# Patient Record
Sex: Male | Born: 1970 | Race: White | Hispanic: No | Marital: Married | Smoking: Former smoker
Health system: Southern US, Community
[De-identification: ages and names within clinical notes are randomized; demographics above are authoritative.]

## PROBLEM LIST (undated history)

## (undated) DIAGNOSIS — G4700 Insomnia, unspecified: Secondary | ICD-10-CM

## (undated) DIAGNOSIS — G241 Genetic torsion dystonia: Secondary | ICD-10-CM

## (undated) DIAGNOSIS — W19XXXA Unspecified fall, initial encounter: Secondary | ICD-10-CM

## (undated) DIAGNOSIS — G4730 Sleep apnea, unspecified: Secondary | ICD-10-CM

## (undated) DIAGNOSIS — R296 Repeated falls: Secondary | ICD-10-CM

## (undated) DIAGNOSIS — IMO0002 Reserved for concepts with insufficient information to code with codable children: Secondary | ICD-10-CM

## (undated) DIAGNOSIS — G249 Dystonia, unspecified: Secondary | ICD-10-CM

## (undated) HISTORY — PX: SHOULDER SURGERY: SHX246

## (undated) HISTORY — DX: Genetic torsion dystonia: G24.1

## (undated) HISTORY — DX: Dystonia, unspecified: G24.9

## (undated) HISTORY — DX: Reserved for concepts with insufficient information to code with codable children: IMO0002

## (undated) HISTORY — PX: KNEE SURGERY: SHX244

## (undated) HISTORY — DX: Repeated falls: R29.6

## (undated) HISTORY — DX: Insomnia, unspecified: G47.30

## (undated) HISTORY — DX: Insomnia, unspecified: G47.00

## (undated) HISTORY — DX: Unspecified fall, initial encounter: W19.XXXA

---

## 2001-10-01 ENCOUNTER — Emergency Department (HOSPITAL_COMMUNITY): Admission: EM | Admit: 2001-10-01 | Discharge: 2001-10-01 | Payer: Self-pay | Admitting: Emergency Medicine

## 2003-07-21 ENCOUNTER — Emergency Department (HOSPITAL_COMMUNITY): Admission: EM | Admit: 2003-07-21 | Discharge: 2003-07-21 | Payer: Self-pay | Admitting: Podiatry

## 2003-11-11 ENCOUNTER — Emergency Department (HOSPITAL_COMMUNITY): Admission: EM | Admit: 2003-11-11 | Discharge: 2003-11-12 | Payer: Self-pay

## 2003-11-16 ENCOUNTER — Ambulatory Visit: Payer: Self-pay | Admitting: Pain Medicine

## 2003-11-23 ENCOUNTER — Ambulatory Visit: Payer: Self-pay | Admitting: Pain Medicine

## 2003-12-02 ENCOUNTER — Ambulatory Visit: Payer: Self-pay | Admitting: Pain Medicine

## 2003-12-10 ENCOUNTER — Ambulatory Visit: Payer: Self-pay | Admitting: Pain Medicine

## 2003-12-28 ENCOUNTER — Ambulatory Visit: Payer: Self-pay | Admitting: Physician Assistant

## 2004-01-06 ENCOUNTER — Ambulatory Visit: Payer: Self-pay | Admitting: Physician Assistant

## 2004-01-19 ENCOUNTER — Ambulatory Visit: Payer: Self-pay | Admitting: Physician Assistant

## 2004-02-18 ENCOUNTER — Ambulatory Visit: Payer: Self-pay | Admitting: Physician Assistant

## 2004-02-23 ENCOUNTER — Ambulatory Visit: Payer: Self-pay | Admitting: General Practice

## 2004-03-14 ENCOUNTER — Ambulatory Visit: Payer: Self-pay | Admitting: Pain Medicine

## 2004-03-23 ENCOUNTER — Ambulatory Visit: Payer: Self-pay | Admitting: Pain Medicine

## 2004-03-24 ENCOUNTER — Ambulatory Visit: Payer: Self-pay | Admitting: Pain Medicine

## 2004-04-07 ENCOUNTER — Ambulatory Visit: Payer: Self-pay | Admitting: Physician Assistant

## 2004-05-10 ENCOUNTER — Ambulatory Visit: Payer: Self-pay | Admitting: Physician Assistant

## 2004-06-29 ENCOUNTER — Ambulatory Visit: Payer: Self-pay | Admitting: Pain Medicine

## 2004-07-20 ENCOUNTER — Ambulatory Visit: Payer: Self-pay | Admitting: Pain Medicine

## 2004-07-27 ENCOUNTER — Ambulatory Visit: Payer: Self-pay | Admitting: Pain Medicine

## 2004-10-14 ENCOUNTER — Encounter: Admission: RE | Admit: 2004-10-14 | Discharge: 2004-10-14 | Payer: Self-pay | Admitting: Neurology

## 2004-11-14 ENCOUNTER — Ambulatory Visit: Payer: Self-pay | Admitting: Pain Medicine

## 2004-11-17 ENCOUNTER — Ambulatory Visit: Payer: Self-pay | Admitting: Pain Medicine

## 2004-12-06 ENCOUNTER — Ambulatory Visit: Payer: Self-pay | Admitting: Physician Assistant

## 2004-12-20 ENCOUNTER — Ambulatory Visit: Payer: Self-pay | Admitting: Physician Assistant

## 2004-12-23 ENCOUNTER — Encounter: Payer: Self-pay | Admitting: Pain Medicine

## 2005-11-23 ENCOUNTER — Ambulatory Visit (HOSPITAL_COMMUNITY): Admission: RE | Admit: 2005-11-23 | Discharge: 2005-11-23 | Payer: Self-pay | Admitting: Neurology

## 2005-12-25 ENCOUNTER — Encounter: Payer: Self-pay | Admitting: Neurology

## 2006-01-19 ENCOUNTER — Ambulatory Visit (HOSPITAL_COMMUNITY): Admission: RE | Admit: 2006-01-19 | Discharge: 2006-01-19 | Payer: Self-pay | Admitting: Internal Medicine

## 2006-04-04 ENCOUNTER — Encounter: Payer: Self-pay | Admitting: Neurology

## 2006-05-01 ENCOUNTER — Encounter: Admission: RE | Admit: 2006-05-01 | Discharge: 2006-05-01 | Payer: Self-pay | Admitting: Neurology

## 2006-06-13 ENCOUNTER — Emergency Department (HOSPITAL_COMMUNITY): Admission: EM | Admit: 2006-06-13 | Discharge: 2006-06-13 | Payer: Self-pay | Admitting: Emergency Medicine

## 2006-10-11 ENCOUNTER — Emergency Department (HOSPITAL_COMMUNITY): Admission: EM | Admit: 2006-10-11 | Discharge: 2006-10-11 | Payer: Self-pay | Admitting: Emergency Medicine

## 2006-11-05 ENCOUNTER — Encounter: Payer: Self-pay | Admitting: Internal Medicine

## 2006-12-01 ENCOUNTER — Encounter: Payer: Self-pay | Admitting: Internal Medicine

## 2007-04-26 ENCOUNTER — Ambulatory Visit: Payer: Self-pay | Admitting: Vascular Surgery

## 2007-04-26 ENCOUNTER — Ambulatory Visit (HOSPITAL_COMMUNITY): Admission: RE | Admit: 2007-04-26 | Discharge: 2007-04-26 | Payer: Self-pay | Admitting: Orthopedic Surgery

## 2007-04-26 ENCOUNTER — Encounter (INDEPENDENT_AMBULATORY_CARE_PROVIDER_SITE_OTHER): Payer: Self-pay | Admitting: Orthopedic Surgery

## 2008-05-27 ENCOUNTER — Emergency Department: Payer: Self-pay | Admitting: Emergency Medicine

## 2008-07-26 ENCOUNTER — Emergency Department: Payer: Self-pay | Admitting: Emergency Medicine

## 2010-02-20 ENCOUNTER — Encounter: Payer: Self-pay | Admitting: Neurology

## 2010-02-20 ENCOUNTER — Encounter: Payer: Self-pay | Admitting: Orthopedic Surgery

## 2010-02-28 ENCOUNTER — Encounter: Admission: RE | Admit: 2010-02-28 | Payer: Self-pay | Source: Home / Self Care | Admitting: Neurology

## 2010-03-07 ENCOUNTER — Ambulatory Visit: Payer: PRIVATE HEALTH INSURANCE | Admitting: Occupational Therapy

## 2010-03-10 ENCOUNTER — Encounter: Payer: Self-pay | Admitting: Occupational Therapy

## 2010-06-17 NOTE — Op Note (Signed)
NAME:  Brandon Lin, Brandon Lin             ACCOUNT NO.:  [id number]   MEDICAL RECORD NO.:  [id number]          PATIENT TYPE:  AMB   LOCATION:  SDS                          FACILITY:  [facility]   PHYSICIAN:  Burnard Bunting, M.D.    DATE OF BIRTH:  [birth year]   DATE OF PROCEDURE:  DATE OF DISCHARGE:  01/19/2006                               OPERATIVE REPORT   PREOPERATIVE DIAGNOSES:  Right knee chondromalacia, meniscal tears,  lateral meniscal tear, and anterior cruciate ligament cyst.   POSTOPERATIVE DIAGNOSES:  Right knee chondromalacia, meniscal tears,  lateral meniscal tear, and anterior cruciate ligament cyst.   PROCEDURE:  Right knee diagnostic arthroscopy with debridement of  chondromalacia on the undersurface of the trochlea and decompression  anterior cruciate ligament cyst and partial lateral meniscectomy.   SURGEON:  Dorene Grebe, MD   ASSISTANT:  None.   ANESTHESIA:  Monitored anesthesia care plus local.   ESTIMATED BLOOD LOSS:  Minimal.   INDICATIONS:  Brandon Lin is a 40 year old patient, who is status  post ACL reconstruction, who presents with locking and inability to  fully extend his left knee.  He presents now for operative management  after failure of conservative management.   PROCEDURE IN DETAIL:  The patient brought to the operating room where  MAC with local anesthetic was induced.  Preoperative Vibramycin  administered.  Right leg, including the foot, was prepped and draped in  a sterile manner. Anatomy was defined with medial and lateral joint line  marked with the patella border  Anterior inferolateral portal was first  established under direct visualization.  Diagnostic arthroscopy was  performed.  Patellofemoral compartment was intact on the under surface  of the patella but there was grade 2-3 chondromalacia in the lining of  the trochlea with unstable chondral flaps which were debrided back to a  stable rim using a combination of basket punch and shaver.   The patient  had significant degenerative changes in the medial compartment.  The  patient had prior partial medial meniscectomy.  The patient also had a  cyst at the stump of his ACL reconstruction.  The ACL reconstruction  itself appeared to be generally intact; although, the ligament was  frayed and somewhat disbanded.  The cyst in the ACL notch was  decompressed.  The lateral compartment showed intact articular cartilage  on the femoral and tibial side with a lateral meniscal tear which was  debrided back to a  stable rim using a combination of basket punch and shavers.  At this  time, the knee joint was thoroughly irrigated.  Instruments were removed  and the portals sites were closed using 3-0 nylon suture.  Morphine,Marcaine and clonidine was injected into the knee.  The patient  tolerated the procedure well without any immediate complications.      Burnard Bunting, M.D.  Electronically Signed     GSD/MEDQ  D:  03/01/2006  T:  03/01/2006  Job:  [id number]

## 2010-07-08 ENCOUNTER — Encounter: Payer: Self-pay | Admitting: Family Medicine

## 2010-07-08 ENCOUNTER — Inpatient Hospital Stay (INDEPENDENT_AMBULATORY_CARE_PROVIDER_SITE_OTHER)
Admission: RE | Admit: 2010-07-08 | Discharge: 2010-07-08 | Disposition: A | Discharge status: Home / Self Care | Payer: PRIVATE HEALTH INSURANCE | Source: Ambulatory Visit | Attending: Family Medicine | Admitting: Family Medicine

## 2010-07-08 DIAGNOSIS — T148XXA Other injury of unspecified body region, initial encounter: Secondary | ICD-10-CM

## 2010-07-12 ENCOUNTER — Telehealth (INDEPENDENT_AMBULATORY_CARE_PROVIDER_SITE_OTHER): Payer: Self-pay | Admitting: *Deleted

## 2010-08-01 ENCOUNTER — Other Ambulatory Visit: Payer: Self-pay | Admitting: Internal Medicine

## 2010-10-31 ENCOUNTER — Ambulatory Visit: Payer: Self-pay | Admitting: Internal Medicine

## 2010-11-01 ENCOUNTER — Ambulatory Visit: Payer: Self-pay | Admitting: Internal Medicine

## 2010-11-08 ENCOUNTER — Other Ambulatory Visit: Payer: Self-pay | Admitting: Family Medicine

## 2010-11-08 ENCOUNTER — Inpatient Hospital Stay: Payer: Self-pay | Admitting: Internal Medicine

## 2010-11-09 DIAGNOSIS — R0602 Shortness of breath: Secondary | ICD-10-CM

## 2011-01-02 NOTE — Progress Notes (Signed)
Summary: RIGHT ARM LACERATION ABOVE ELBOW   Vital Signs:  Patient Profile:   40 Years Old Male CC:      Laceration to rt forearm today Tetanus is up-to-date Height:     68.5 inches Weight:      250 pounds O2 Sat:      99 % O2 treatment:    Room Air Temp:     98.7 degrees F oral Pulse rate:   91 / minute Pulse rhythm:   regular Resp:     16 per minute BP sitting:   110 / 77  (left arm) Cuff size:   large  Vitals Entered By: Emilio Math (July 08, 2010 4:54 PM)                  Current Allergies: No known allergies History of Present Illness Chief Complaint: Laceration to rt forearm today Tetanus is up-to-date History of Present Illness:  Subjective:  Patient complains of laceration to right forearm by sheet metal today.  Current Meds BACLOFEN 20 MG TABS (BACLOFEN)  ZANAFLEX 6 MG CAPS (TIZANIDINE HCL)  LYRICA 75 MG CAPS (PREGABALIN)  LORTAB 5 5-500 MG TABS (HYDROCODONE-ACETAMINOPHEN) One or two tabs by mouth q4 to 6hr as needed pain  REVIEW OF SYSTEMS Constitutional Symptoms      Denies fever, chills, night sweats, weight loss, weight gain, and fatigue.  Eyes       Denies change in vision, eye pain, eye discharge, glasses, contact lenses, and eye surgery. Ear/Nose/Throat/Mouth       Denies hearing loss/aids, change in hearing, ear pain, ear discharge, dizziness, frequent runny nose, frequent nose bleeds, sinus problems, sore throat, hoarseness, and tooth pain or bleeding.  Respiratory       Denies dry cough, productive cough, wheezing, shortness of breath, asthma, bronchitis, and emphysema/COPD.  Cardiovascular       Denies murmurs, chest pain, and tires easily with exhertion.    Gastrointestinal       Denies stomach pain, nausea/vomiting, diarrhea, constipation, blood in bowel movements, and indigestion. Genitourniary       Denies painful urination, kidney stones, and loss of urinary control. Neurological       Denies paralysis, seizures, and  fainting/blackouts. Musculoskeletal       Denies muscle pain, joint pain, joint stiffness, decreased range of motion, redness, swelling, muscle weakness, and gout.  Skin       Denies bruising, unusual mles/lumps or sores, and hair/skin or nail changes.      Comments: Laceration Psych       Denies mood changes, temper/anger issues, anxiety/stress, speech problems, depression, and sleep problems.  Past History:  Past Medical History: RSD Dystonia  Past Surgical History: Shoulder nerve decompressions ACL  Rt Knee  Social History: Non smoker No ETOH No DRugs   Objective:  No acute distress  Right forearm:  4cm long superficial laceration proximally.  Distal neurovascular intact  Assessment New Problems: LACERATION (ICD-879.8)   Plan New Medications/Changes: LORTAB 5 5-500 MG TABS (HYDROCODONE-ACETAMINOPHEN) One or two tabs by mouth q4 to 6hr as needed pain  #12 (twelve) x 0, 07/08/2010, Donna Christen MD  New Orders: Repair Superficial Wound(s) 2.6cm to 7.5cm [12002] New Patient Level III [99203] Planning Comments:   Advised to change bandage daily and apply Bacitracin.  Return in 10 days for suture removal. Return for any signs of infection. Rx for Lortab for pain.   The patient and/or caregiver has been counseled thoroughly with regard to medications prescribed including  dosage, schedule, interactions, rationale for use, and possible side effects and they verbalize understanding.  Diagnoses and expected course of recovery discussed and will return if not improved as expected or if the condition worsens. Patient and/or caregiver verbalized understanding.   PROCEDURE:  Suture Site: Right forearm Size: 4cm Number of Lacerations: One Anesthesia: 1% Lidocaine w/epinephrine Procedure: Procedure:  Laceration Repair Discussed benefits and risks of procedure and verbal consent obtained. Using sterile technique and local 1% lidocaine with epinephrine, cleansed wound with  Betadine followed by copious lavage with normal saline.  Wound carefully inspected for debris and foreign bodies; none found.  Wound closed with #7 , 4-0 interrupted nylon sutures.  Bacitracin and non-stick sterile dressing applied.  Wound precautions explained to patient.    Disposition: Home Prescriptions: LORTAB 5 5-500 MG TABS (HYDROCODONE-ACETAMINOPHEN) One or two tabs by mouth q4 to 6hr as needed pain  #12 (twelve) x 0   Entered and Authorized by:   Donna Christen MD   Signed by:   Donna Christen MD on 07/08/2010   Method used:   Print then Give to Patient   RxID:   [id number]   Orders Added: 1)  Repair Superficial Wound(s) 2.6cm to 7.5cm [12002] 2)  New Patient Level III [28413]

## 2011-01-02 NOTE — Telephone Encounter (Signed)
  Phone Note Outgoing Call Call back at [phone]   Call placed by: Lajean Saver RN,  July 12, 2010 9:13 AM Call placed to: Patient Action Taken: Phone Call Completed Summary of Call: Callback: Patient reoprts he is changing his bandage as instructed. "The stitches look perfect". Reminded him to return on June 18th for suture removal.

## 2011-02-06 ENCOUNTER — Encounter: Payer: PRIVATE HEALTH INSURANCE | Admitting: Physical Medicine & Rehabilitation

## 2012-02-12 IMAGING — CT CT CHEST W/ CM
1 of 2 series · 15 of 35 positions shown, 19 images · IV contrast (APPLIED)
Comparison: none

REASON FOR EXAM: Concern for aortic dissection, tearing back pain,
elevated D-dimer
COMMENTS:

[Series 6: soft tissue · axial · 0.76mm/px · z∈[-304,+34]mm · 15 of 123 slices shown, 19 images]
[im 5/123  mediastinal]
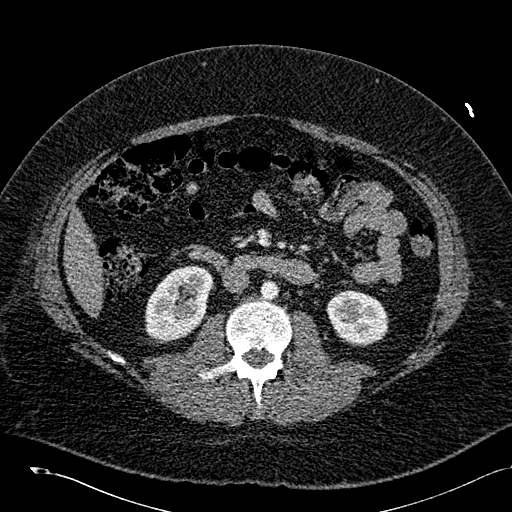
[im 5/123  lung]
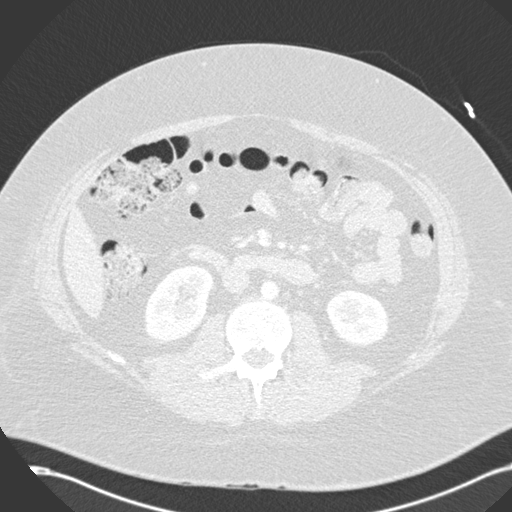
[im 14/123  lung]
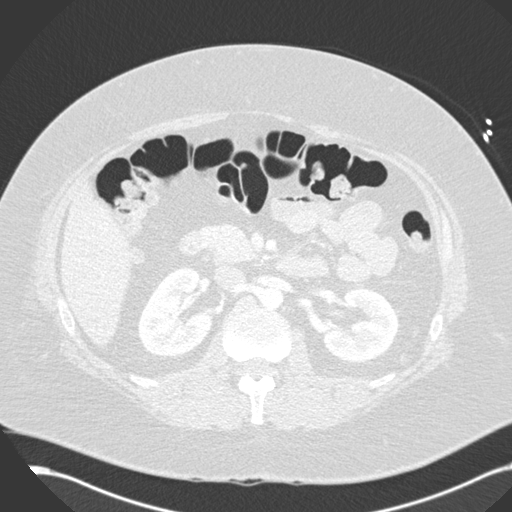
[im 23/123  lung]
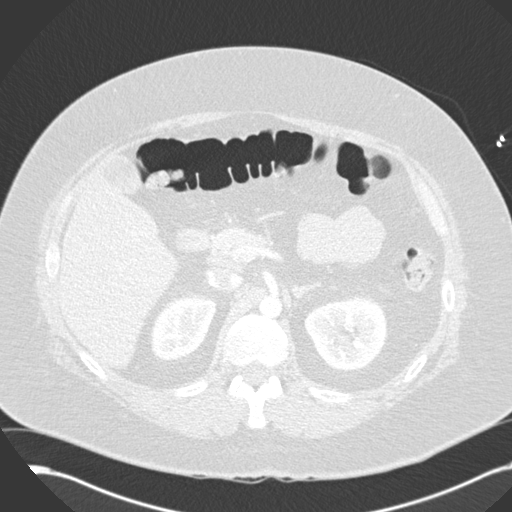
[im 32/123  lung]
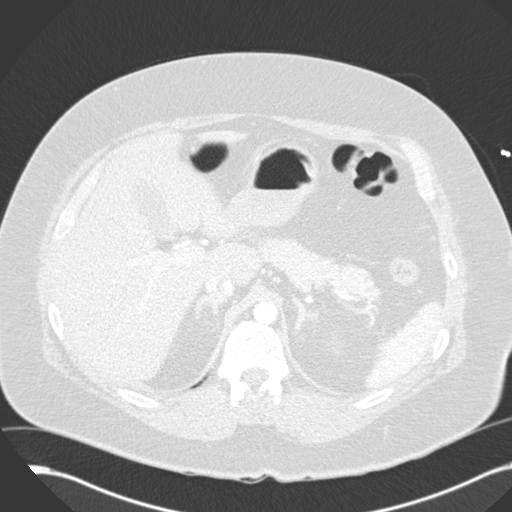
[im 41/123  mediastinal]
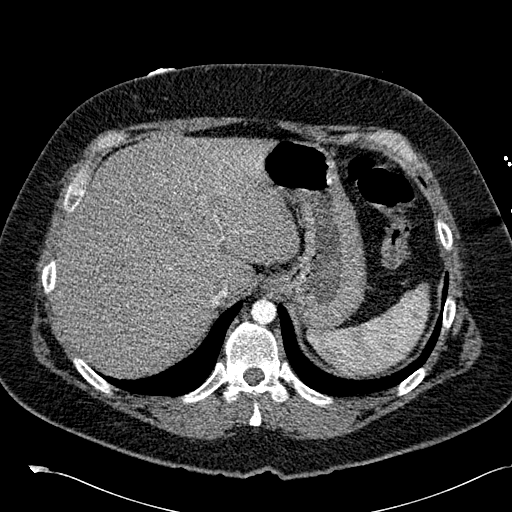
[im 41/123  lung]
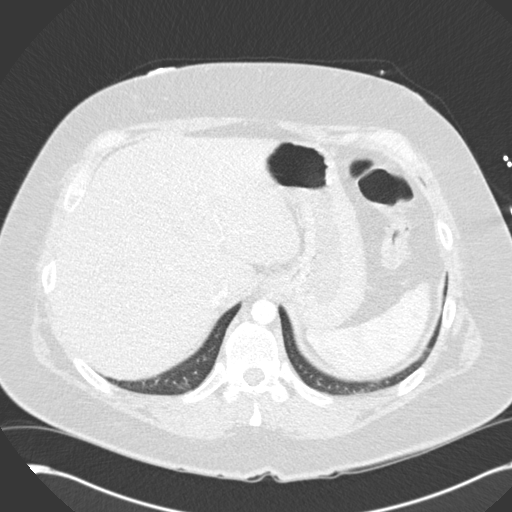
[im 46/123  lung]
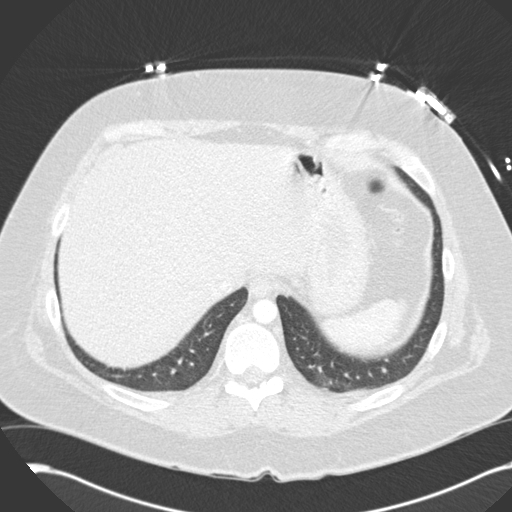
[im 55/123  lung]
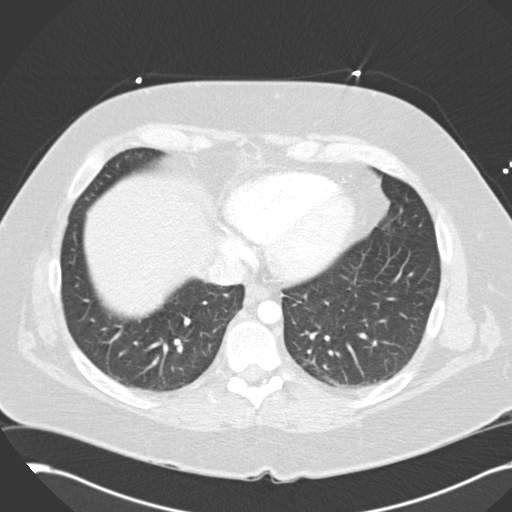
[im 62/123  lung]
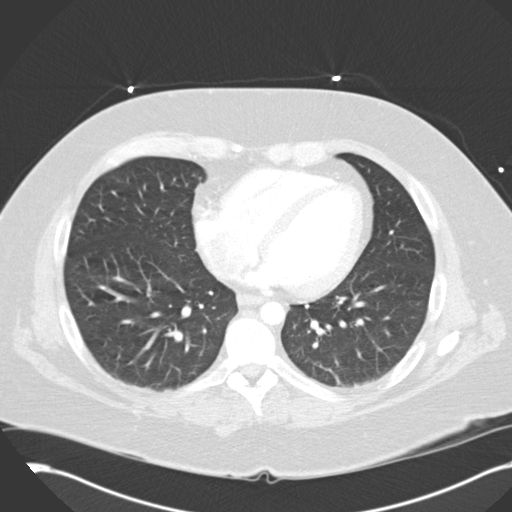
[im 68/123  mediastinal]
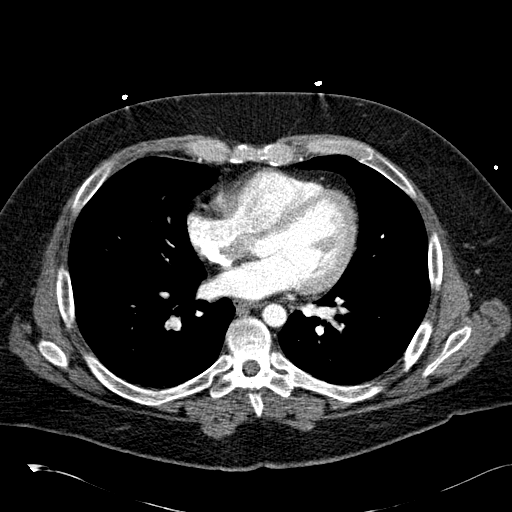
[im 68/123  lung]
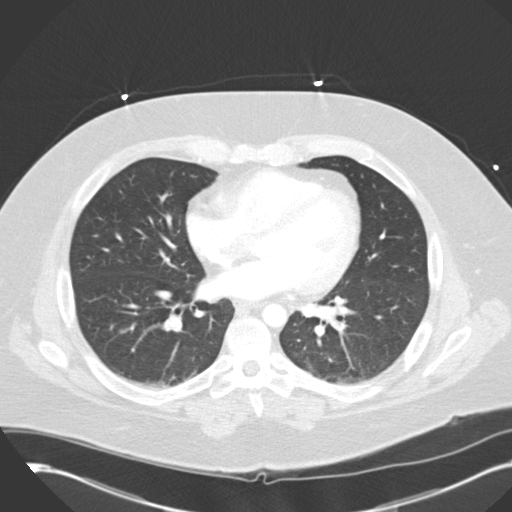
[im 77/123  lung]
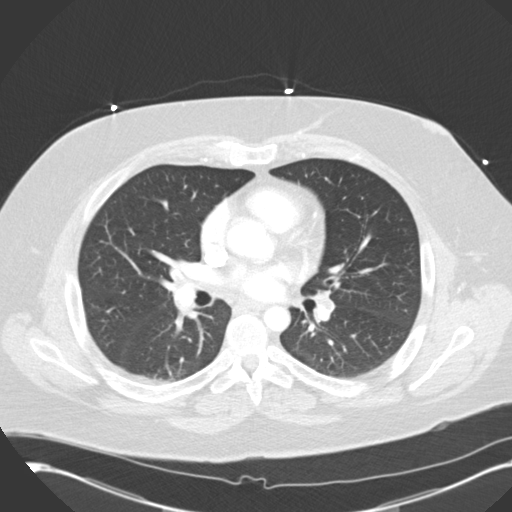
[im 82/123  lung]
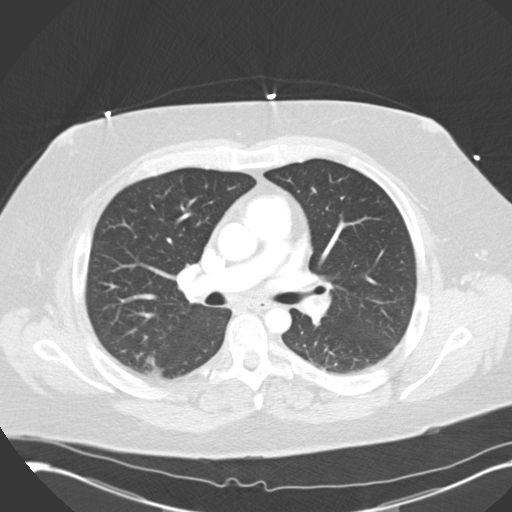
[im 91/123  lung]
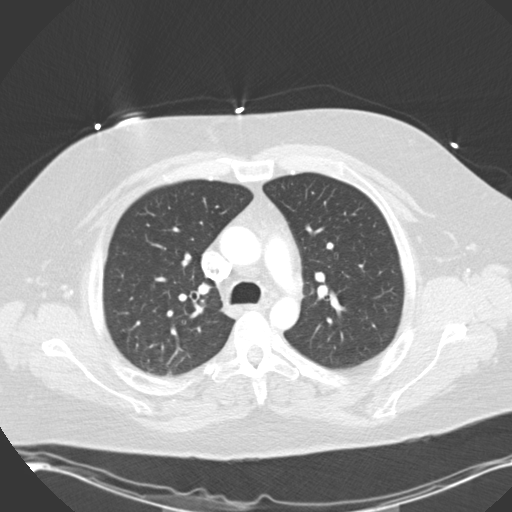
[im 100/123  mediastinal]
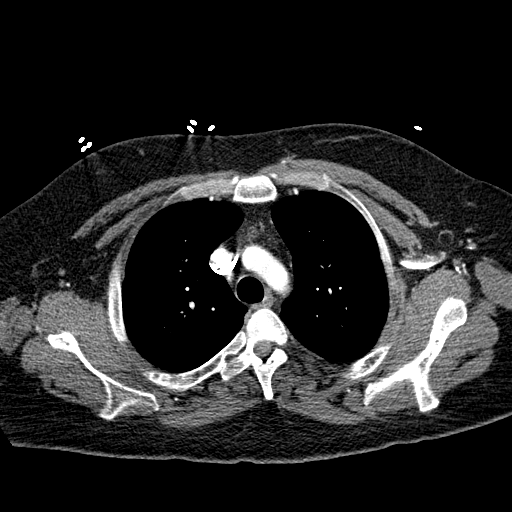
[im 100/123  lung]
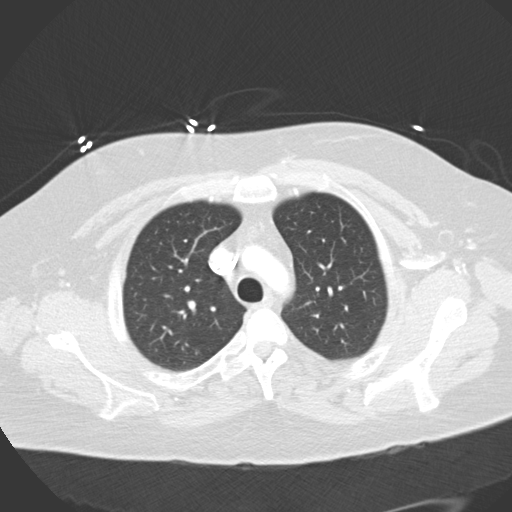
[im 109/123  lung]
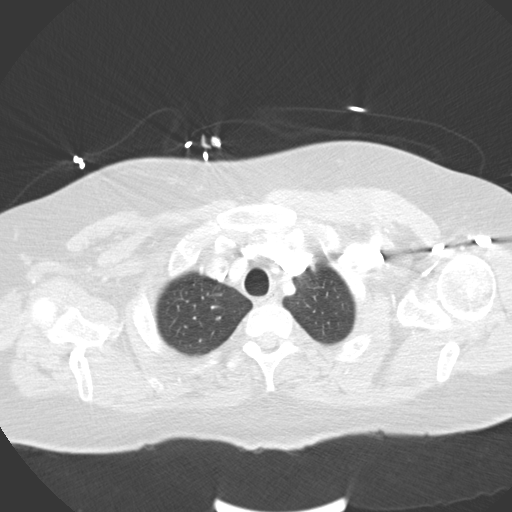
[im 118/123  lung]
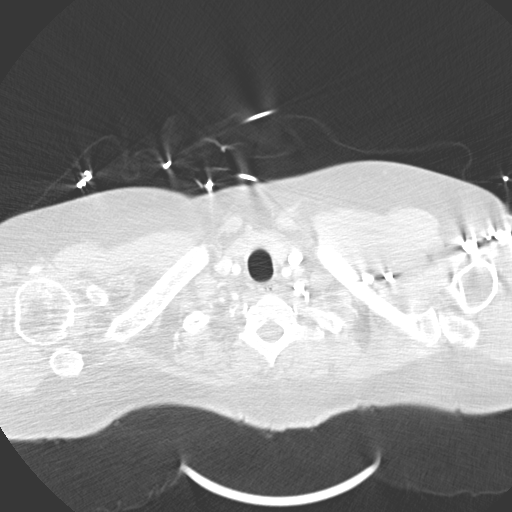

[15 of 35 positions shown; findings below may reference images not displayed]

PROCEDURE:     CT  - CT CHEST WITH CONTRAST  - November 08, 2010  [DATE]

RESULT:     CT of the chest is performed utilizing 100 mL of 8sovue-91A
iodinated intravenous contrast with cardiac gating. Images are reconstructed
at 3.0 mm slice thickness in the axial plane.

The study is positive for pulmonary emboli in the left lower lobe and in the
left upper lobe tiny subsegmental branch emboli are seen in the right lower
lobe. Medial segment right middle lobe arterial embolic appearance is
present. There is no evidence of thrombus or embolus in the main pulmonary
Britton Mcloughlin or left or right main pulmonary arteries. There is thrombus at
the left main pulmonary [place] division

The thoracic aorta is normal in caliber without dissection. The included
upper abdominal structures demonstrate density within the lower pole left
renal collecting system consistent with stones but are otherwise
unremarkable.  No pleural or pericardial effusion is evident. The lungs
appear to show normal aeration with some minimal dependent atelectasis.
IMPRESSION: Bilateral small pulmonary emboli. No thoracic aortic
aneurysm or dissection.  Findings were called to Dr. Harari at the
completion of the exam.(*)

## 2012-04-23 ENCOUNTER — Encounter: Payer: Self-pay | Admitting: Neurology

## 2012-04-23 ENCOUNTER — Ambulatory Visit (INDEPENDENT_AMBULATORY_CARE_PROVIDER_SITE_OTHER): Payer: Medicare Other | Admitting: Neurology

## 2012-04-23 VITALS — Wt 304.0 lb

## 2012-04-23 DIAGNOSIS — G90519 Complex regional pain syndrome I of unspecified upper limb: Secondary | ICD-10-CM | POA: Insufficient documentation

## 2012-04-23 DIAGNOSIS — R259 Unspecified abnormal involuntary movements: Secondary | ICD-10-CM

## 2012-04-23 DIAGNOSIS — G249 Dystonia, unspecified: Secondary | ICD-10-CM | POA: Insufficient documentation

## 2012-04-23 DIAGNOSIS — IMO0002 Reserved for concepts with insufficient information to code with codable children: Secondary | ICD-10-CM

## 2012-04-23 DIAGNOSIS — G243 Spasmodic torticollis: Secondary | ICD-10-CM

## 2012-04-23 DIAGNOSIS — G589 Mononeuropathy, unspecified: Secondary | ICD-10-CM

## 2012-04-23 HISTORY — DX: Dystonia, unspecified: G24.9

## 2012-04-23 HISTORY — DX: Reserved for concepts with insufficient information to code with codable children: IMO0002

## 2012-04-23 MED ORDER — ONABOTULINUMTOXINA 100 UNITS IJ SOLR
500.0000 [IU] | Freq: Once | INTRAMUSCULAR | Status: AC
  Administered 2012-04-23: 500 [IU] via INTRAMUSCULAR

## 2012-04-23 NOTE — Progress Notes (Addendum)
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  He falled off a deck and landed on his right neck and shoulder area, had recurrent right shoulder pain with multiple surgeries, unfortunately despite all the surgical intervention, he has developed intractable pain in his right shoulder and neck, dystonic postural.  Over the past few years, he has been under the care of Dr. Allena Katz, receiving Botox injections every 90 days, which does help his right neck and shoulder pain.   His right shoulder, neck, arm, and hand are very sensitive to touch, previously tried occupational therapy, but with limited result, he has underlying constant pain, exacerbated by stimulating to the sensitive area. He is also taking baclofen 20 mg b.i.d., Lyrica 75 mg t.i.d., tizanidine 4 mg b.i.d., OxyCodone 3 tablets t.i.d.  His father died of leukemia at age 40, mother at age 47, is healthy. He is disabled due to his condition. Married, has a 37years old son.  Also had multiple right knee arthroscopic surgery due to sports related right knee injury.  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  BOTOX has been very helpful, but he continues to have right neck, left neck and right  shoulder spastic pain, wearing off in 2 months. He receivedd ketamine infusin as [facility] inpatient pain management, also has local compounding cream.  UPDATE March 25th 2014: Last injection was in January 05 2012, injection was focusing on his bilateral neck and shoulder region, He responded very well to injection, denies significant side effects, which has helped in relieving his shoulder and neck pain, today he complains left upper shoulder, bilateral neck, right shoulder pain,  Review of Systems  Out of a complete 14 system review, the patient complains of only the following symptoms, and all other reviewed systems are negative.   Constitutional:    N/A Cardiovascular:  N/A Ear/Nose/Throat:  N/A Skin: N/A Eyes: N/A Respiratory: N/A Gastroitestinal: N/A    Hematology/Lymphatic:  N/A Musculoskeletal: right hand dystonic posture, bilateral neck and shoulder pain. Endocrine:  N/A Neurological: N/A Psychiatric:    N/A   Physical Exam  General: Pleasant seated in no acute distress Respiratory: clear to ausculuation bilaterally Cardiovascular: regular rate, rhythm Musculoskeletal: Right upper extremity with dystonia  Neurologic Exam  Cranial Nerves: PERL. Visual fields full. EOM's intact. No facial weakness. Hearing normal. Tongue and palate move normally Motor: He has neck rotated to right, right tilt, left shoulder elevation, right shoulder in anterior rotation, right elbow flexion and right hand MPI flexion, PPI and DPI extended position Sensory:  allodynia upon palpitation. Coordination: no dysmetria Gait and Station: tends to lean towards his right side, mild dsytonic posture of his right leg.   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 EMG guided BOTOX A injection, (lot C3496 C3, Exp Oct 2016).  100 units of Botox was dissolved into 2 cc of normal saline. 500 units of BOTOX was used.    Right superior trapezius  50 units Right splenius cervicus  50 Right splenius capitus 50 Right Levator scapulae 50 Right illiocostalis 50 Right scalenus posterior 50 Right pectroalis major 50  Left levator scapulae  50 Left splenius capitus 50 Left upper trapezius 50 units   he will come back in  3 months for repeat injections.

## 2012-05-17 ENCOUNTER — Telehealth: Payer: Self-pay

## 2012-05-17 NOTE — Telephone Encounter (Signed)
Baxter Hire called and left a message asking if we were still prescribing Oxycodone for this patient.  The insurance did a Narcotic Drug Audit on this patient for the year of 2013. She also wanted to know if we were affiliated with any of the following prescribers who also wrote narcotic medications for this patient in 2013: [facility], [facility], [facility], [place] or [place].  Left a call back number of [phone].  I called her back.  Got no answer.  Left message that we are not prescribing Oxycodone for this patient (See notes in [facility]) and we are not affiliated with any of the other physicians she inquired about.

## 2012-07-17 ENCOUNTER — Ambulatory Visit (INDEPENDENT_AMBULATORY_CARE_PROVIDER_SITE_OTHER): Payer: Medicare Other | Admitting: Neurology

## 2012-07-17 ENCOUNTER — Encounter: Payer: Self-pay | Admitting: Neurology

## 2012-07-17 VITALS — Ht 68.0 in | Wt 291.0 lb

## 2012-07-17 DIAGNOSIS — R259 Unspecified abnormal involuntary movements: Secondary | ICD-10-CM

## 2012-07-17 DIAGNOSIS — IMO0002 Reserved for concepts with insufficient information to code with codable children: Secondary | ICD-10-CM

## 2012-07-17 DIAGNOSIS — G90519 Complex regional pain syndrome I of unspecified upper limb: Secondary | ICD-10-CM

## 2012-07-17 DIAGNOSIS — G243 Spasmodic torticollis: Secondary | ICD-10-CM

## 2012-07-17 DIAGNOSIS — G249 Dystonia, unspecified: Secondary | ICD-10-CM

## 2012-07-17 MED ORDER — ONABOTULINUMTOXINA 100 UNITS IJ SOLR
500.0000 [IU] | Freq: Once | INTRAMUSCULAR | Status: AC
  Administered 2012-07-17: 500 [IU] via INTRAMUSCULAR

## 2012-07-17 NOTE — Progress Notes (Signed)
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  He falled off a deck and landed on his right neck and shoulder area, he had recurrent right shoulder pain with multiple surgeries, unfortunately despite all the surgical intervention, he has developed intractable pain in his right shoulder and neck, dystonic postural.  Over the past few years, he has been under the care of Dr. Jodi Mourning, receiving Botox injections every 90 days, which does help his right neck and shoulder pain.   His right shoulder, neck, arm, and hand are very sensitive to touch, previously tried occupational therapy, but with limited result, he has underlying constant pain, exacerbated by stimulating to the sensitive area. He is also taking baclofen 20 mg b.i.d., Lyrica 75 mg t.i.d., tizanidine 4 mg b.i.d., OxyCodone 3 tablets t.i.d.  His father died of leukemia at age 87, mother at age 59, is healthy. He is disabled due to his condition. Married, has a 63years old son.  Also had multiple right knee arthroscopic surgery due to sports related right knee injury.  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  BOTOX has been very helpful, but he continues to have right neck, left neck and right  shoulder spastic pain, wearing off in 2 months. He receivedd ketamine infusin as [facility] inpatient pain management, also has local compounding cream.  UPDATE June 18th 2014: Last injection was in March 2014, injection was focusing on his bilateral neck and shoulder region.  He responded very well to injection, denies significant side effects, which has helped in relieving his shoulder and neck pain, but at best, he still has 6/10 right neck and shoulder pain.  Review of Systems  Out of a complete 14 system review, the patient complains of only the following symptoms, and all other reviewed systems are negative.   Constitutional:   N/A Cardiovascular:   N/A Ear/Nose/Throat:  N/A Skin: N/A Eyes: N/A Respiratory: N/A Gastroitestinal: N/A    Hematology/Lymphatic:  N/A Musculoskeletal: right hand dystonic posture, bilateral neck and shoulder pain. Endocrine:  N/A Neurological: N/A Psychiatric:    N/A   Physical Exam  General: Pleasant seated in no acute distress Respiratory: clear to ausculuation bilaterally Cardiovascular: regular rate, rhythm Musculoskeletal: Right upper extremity with dystonia  Neurologic Exam  Cranial Nerves: pupils were equal reactive to light. Visual fields full. EOM's intact. He has no facial muscleweakness. Hearing normal. Tongue and palate move normally Motor: He has neck rotated to right, right tilt, left shoulder elevation, right shoulder in anterior rotation, right elbow flexion and right hand MPI flexion, PPI and DPI extended position Sensory:  allodynia upon palpitation of neck, shoulder, upper back. Coordination: no dysmetria Gait and Station: tends to lean towards his right side, mild dsytonic posture of his right leg.   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 EMG guided BOTOX A injection, (lot C3538, C3504 C3, Exp Oct 2016).  100 units of Botox was dissolved into 2 cc of normal saline. 500 units of BOTOX was used.    Right superior trapezius  50 units Right splenius cervicus  100 Right splenius capitus 50 Right Levator scapulae 50 Right illiocostalis 50 Right scalenus posterior 50 Right pectroalis major 50  Left levator scapulae  50 Left splenius capitus 50   He will come back in  3 months for repeat injections.

## 2012-07-24 ENCOUNTER — Other Ambulatory Visit: Payer: Self-pay

## 2012-07-24 MED ORDER — LORAZEPAM 1 MG PO TABS: 1.0000 mg | ORAL_TABLET | Freq: Two times a day (BID) | ORAL | Status: DC

## 2012-07-24 NOTE — Telephone Encounter (Signed)
Patient called front desk requesting Dr Terrace Arabia refill Ativan Rx.

## 2012-10-23 ENCOUNTER — Ambulatory Visit: Payer: Medicare Other | Admitting: Neurology

## 2012-10-30 ENCOUNTER — Ambulatory Visit: Payer: Managed Care, Other (non HMO) | Admitting: Neurology

## 2012-11-13 ENCOUNTER — Encounter: Payer: Self-pay | Admitting: Neurology

## 2012-11-13 ENCOUNTER — Ambulatory Visit (INDEPENDENT_AMBULATORY_CARE_PROVIDER_SITE_OTHER): Payer: Managed Care, Other (non HMO) | Admitting: Neurology

## 2012-11-13 VITALS — BP 124/84 | HR 91 | Ht 68.0 in | Wt 272.0 lb

## 2012-11-13 DIAGNOSIS — R259 Unspecified abnormal involuntary movements: Secondary | ICD-10-CM

## 2012-11-13 DIAGNOSIS — IMO0002 Reserved for concepts with insufficient information to code with codable children: Secondary | ICD-10-CM

## 2012-11-13 DIAGNOSIS — G90519 Complex regional pain syndrome I of unspecified upper limb: Secondary | ICD-10-CM

## 2012-11-13 DIAGNOSIS — G249 Dystonia, unspecified: Secondary | ICD-10-CM

## 2012-11-13 DIAGNOSIS — G243 Spasmodic torticollis: Secondary | ICD-10-CM

## 2012-11-13 MED ORDER — ONABOTULINUMTOXINA 100 UNITS IJ SOLR
500.0000 [IU] | Freq: Once | INTRAMUSCULAR | Status: AC
  Administered 2012-11-13: 500 [IU] via INTRAMUSCULAR

## 2012-11-13 MED ORDER — LORAZEPAM 1 MG PO TABS: 1.0000 mg | ORAL_TABLET | Freq: Three times a day (TID) | ORAL | Status: DC

## 2012-11-13 NOTE — Progress Notes (Addendum)
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  He falled off a deck and landed on his right neck and shoulder area, he had recurrent right shoulder pain with multiple surgeries, unfortunately despite all the surgical intervention, he has developed intractable pain in his right shoulder and neck, dystonic postural.  Over the past few years, he has been under the care of Dr. Jodi Mourning, receiving Botox injections every 90 days, which does help his right neck and shoulder pain.   His right shoulder, neck, arm, and hand are very sensitive to touch, previously tried occupational therapy, but with limited result, he has underlying constant pain, exacerbated by stimulating to the sensitive area. He is also taking baclofen 20 mg b.i.d., Lyrica 75 mg t.i.d., tizanidine 4 mg b.i.d., OxyCodone 3 tablets t.i.d.  His father died of leukemia at age 78, mother at age 24, is healthy. He is disabled due to his condition. Married, has a 66years old son.  Also had multiple right knee arthroscopic surgery due to sports related right knee injury.  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  BOTOX has been very helpful, but he continues to have right neck, left neck and right  shoulder spastic pain, wearing off in 2 months. He receivedd ketamine infusin as [facility] inpatient pain management, also has local compounding cream.  UPDATE  Oct 15th 2014: Last injection was in July 2014, injection was focusing on his bilateral neck and shoulder region.  He responded very well to injection, denies significant side effects, which has helped in relieving his shoulder and neck pain, He is also at pain management at [facility], receiving Ketamin infusion.  Review of Systems  Out of a complete 14 system review, the patient complains of only the following symptoms, and all other reviewed systems are negative.   Constitutional:    N/A Cardiovascular:  N/A Ear/Nose/Throat:  N/A Skin: N/A Eyes: N/A Respiratory: N/A Gastroitestinal: N/A    Hematology/Lymphatic:  N/A Musculoskeletal: right hand dystonic posture, bilateral neck and shoulder pain. Endocrine:  N/A Neurological: N/A Psychiatric:    N/A   Physical Exam  General: Pleasant seated in no acute distress Respiratory: clear to ausculuation bilaterally Cardiovascular: regular rate, rhythm Musculoskeletal: Right upper extremity with dystonia  Neurologic Exam  Cranial Nerves: pupils were equal reactive to light. Visual fields full. EOM's intact. He has no facial muscleweakness. Hearing normal. Tongue and palate move normally Motor: He has neck rotated to right, right tilt, left shoulder elevation, right shoulder in anterior rotation, right elbow flexion and right hand MPI flexion, PPI and DPI extended position Sensory:  allodynia upon palpitation of neck, shoulder, upper back. Coordination: no dysmetria Gait and Station: tends to lean towards his right side, mild dsytonic posture of his right leg.   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 EMG guided BOTOX A injection, (lot C3656 Exp May 2017).  100 units of Botox was dissolved into 2 cc of normal saline. 500 units of BOTOX was used.    Right superior trapezius  25  units Right splenius cervicus   75 Right splenius capitus 50 Right Levator scapulae 50 Right illiocostalis 50 Right scalenus posterior 50 Right pectroalis major 50  Left levator scapulae  50 Left splenius capitus 50 Left trapezius 50 units   He will come back in  3 months for repeat injections.

## 2012-12-04 ENCOUNTER — Telehealth: Payer: Self-pay | Admitting: Neurology

## 2012-12-04 NOTE — Telephone Encounter (Signed)
Wife said that husband has muscle dystonia and hands are drawed up, can't stand seatbelt wear, it hurts and pulls against his neck and shoulders, worse after recent Botox.

## 2012-12-04 NOTE — Telephone Encounter (Signed)
I have called him, since injection, he has worsening left neck, shoulder pain following his botox injection in Oct 15th, which is unusual for him, he will have [facility] pain management follow up tomorrow.

## 2013-02-13 ENCOUNTER — Encounter (INDEPENDENT_AMBULATORY_CARE_PROVIDER_SITE_OTHER): Payer: Self-pay

## 2013-02-13 ENCOUNTER — Encounter: Payer: Self-pay | Admitting: Neurology

## 2013-02-13 ENCOUNTER — Ambulatory Visit (INDEPENDENT_AMBULATORY_CARE_PROVIDER_SITE_OTHER): Payer: Managed Care, Other (non HMO) | Admitting: Neurology

## 2013-02-13 VITALS — Ht 68.0 in | Wt 274.0 lb

## 2013-02-13 DIAGNOSIS — G249 Dystonia, unspecified: Secondary | ICD-10-CM

## 2013-02-13 DIAGNOSIS — G90519 Complex regional pain syndrome I of unspecified upper limb: Secondary | ICD-10-CM

## 2013-02-13 DIAGNOSIS — IMO0002 Reserved for concepts with insufficient information to code with codable children: Secondary | ICD-10-CM

## 2013-02-13 DIAGNOSIS — G243 Spasmodic torticollis: Secondary | ICD-10-CM

## 2013-02-13 DIAGNOSIS — R259 Unspecified abnormal involuntary movements: Secondary | ICD-10-CM

## 2013-02-13 DIAGNOSIS — G241 Genetic torsion dystonia: Secondary | ICD-10-CM

## 2013-02-13 MED ORDER — ONABOTULINUMTOXINA 100 UNITS IJ SOLR
550.0000 [IU] | Freq: Once | INTRAMUSCULAR | Status: AC
  Administered 2013-02-13: 550 [IU] via INTRAMUSCULAR

## 2013-02-13 NOTE — Progress Notes (Signed)
Patient is a 43 year old right-handed Caucasian male, has suffered complex regional pain syndrome following a right shoulder injury in 1998, he to me every 3 months for EMG guided Botox injection for his right upper extremity spasticity.  He fell off a deck and landed on his right neck and shoulder area, he had recurrent right shoulder pain with multiple surgeries since 1998, unfortunately despite all the surgical intervention, he has developed intractable pain in his right shoulder and neck, dystonic postural.  Over the past few years, he has been under the care of Dr. Jodi Mourning, receiving Botox injections every 90 days, which does help his right neck and shoulder pain.   His right shoulder, neck, arm, and hand are very sensitive to touch, previously tried occupational therapy, but with limited result, he has underlying constant pain, exacerbated by stimulating to the sensitive area. He is under pain management at [facility], he is on polypharmacy treatment,  He is taking baclofen 20 mg b.i.d.,Neurontin 300 mg 4 times a day, Ativan 1 mg 3 times a day, OxyCodone  up to 300 mg a day. He is also receiving IV ketamine treatment every 6 months at [facility]  His father died of leukemia at age 32, mother  is healthy. He is disabled due to his condition. Married, has a son. 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    BOTOX has been very helpful, but he continues to have right neck, left neck and right  shoulder spastic pain, wearing off in 2 months.   UPDATE  Jan 15th 2015 Last injection was in Oct 2014, he received total of 500 units of BOTOX A.  He responded very well to injection, which has helped his right shoulder, neck pain, he has mild difficulty raising up his neck  Review of Systems  Out of a complete 14 system review, the patient complains of only the  following symptoms, and all other reviewed systems are negative.  He has suffered a kidney stone over the past few months, constant right shoulder, bilateral neck pain, dystonic right arm posture   Physical Exam  General: Pleasant seated in no acute distress Respiratory: clear to ausculuation bilaterally Cardiovascular: regular rate, rhythm Musculoskeletal: Right upper extremity with dystonia  Neurologic Exam  Cranial Nerves: pupils were equal reactive to light. Visual fields full. EOM's intact. He has no facial muscleweakness. Hearing normal. Tongue and palate move normally Motor: He has neck rotated to right, right tilt, left shoulder elevation, right shoulder in anterior rotation, right elbow flexion and right hand MPI flexion, PPI and DPI extended position Sensory:  allodynia upon palpitation of neck, shoulder, upper back. Coordination: no dysmetria Gait and Station: tends to lean towards his right side, mild dsytonic posture of his right leg.   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 EMG guided BOTOX A injection, (lot [id number] Exp May 2017, [id number], expire June 2017).  100 units of Botox was dissolved into 2 cc of normal saline. 550 units of BOTOX was used.    Right superior trapezius  25x2  Units =50  Right splenius cervicus   25 x2 = 50  Right splenius capitus  25 x2= 50 Right Levator scapulae 25 Right longissimus dorsi 25  Right illiocostalis 25x2=50 Right pectroalis major  25x2 =50  Right teres  major 25x2=50  Right latissimus dorsi 25x2=50  Right biceps 25 Right brachialis 25  Left levator scapulae   25 Left splenius capitus 25  Left trapezius 25 x2 units= 50   He will come back in  3 months for repeat injections.

## 2013-04-07 ENCOUNTER — Telehealth: Payer: Self-pay | Admitting: Neurology

## 2013-04-07 NOTE — Telephone Encounter (Signed)
Pt is scheduled to have Botox in April, however he has been having painful muscle spasms everyday.  He is sometimes having them twice a day.  He is wondering if something can be done until he is scheduled to have his next injection.  If you can't reach him on his cell number, he asked for us to try his wife at [phone].  Please call to discuss.  Thank you

## 2013-04-07 NOTE — Telephone Encounter (Signed)
Patient states he is having painful muscle spasms twice a day.  Patient wants to know what can be done , he has an appointment in April. Please advise.   Thanks.

## 2013-04-08 NOTE — Telephone Encounter (Signed)
I spoke to wife.  [phone].  Appt is scheduled 05-15-13 for botox.  He has had for the last month tightening, knot like spasms 2-3 x daily which are very painful.  (each spell lasts for about 45-7090mins).  He is taking all of his medications ordered ( gabapentin, oxycodone, ativan, baclofen ).  He scheduled for IV ketamine she stated next month (had last in January?) every 6 mo?   Asking if there is anything else he can get to get him by until April 16th.   As far as I know every 64mo is still time frame for botox injections.

## 2013-04-09 MED ORDER — CLONAZEPAM 1 MG PO TABS: 1.0000 mg | ORAL_TABLET | Freq: Two times a day (BID) | ORAL | Status: DC

## 2013-04-09 MED ORDER — DIVALPROEX SODIUM ER 500 MG PO TB24: 500.0000 mg | ORAL_TABLET | Freq: Every day | ORAL | Status: DC

## 2013-04-09 NOTE — Telephone Encounter (Signed)
I have called him, he complains of bad muscle spasm all over his body, sometimes lasting for 30 minutes whole body contracted, jaw to legs,  He has 2 episodes in one day,   After the episodes, he is so wear out, he can not move again, it will hit again, sometimes he could not swallowing, ativan was helping.   He has tried tizanidine is helps too, but he is very sleepy afterwards.  Will Depakote ER 500mg  qhs  Clonazepam 1mg  tid, ativan 1mg  prn.  Jessisca, please make sure that he got his prescription today. Thanks.

## 2013-04-09 NOTE — Telephone Encounter (Signed)
I called the pharmacy.  Spoke with Crystal.  Verified medications verbally.  They will process Rx's and contact patient when meds are ready.

## 2013-04-10 ENCOUNTER — Ambulatory Visit: Payer: Self-pay | Admitting: *Deleted

## 2013-04-10 NOTE — Progress Notes (Signed)
Error

## 2013-04-15 ENCOUNTER — Telehealth: Payer: Self-pay | Admitting: *Deleted

## 2013-04-15 MED ORDER — TIZANIDINE HCL 2 MG PO TABS: 4.0000 mg | ORAL_TABLET | Freq: Four times a day (QID) | ORAL | Status: DC | PRN

## 2013-04-15 NOTE — Telephone Encounter (Signed)
I have called him, he still has muscle spasm,     Tizanidine was helpful, I have called in 2mg ,  4 mg q6 hours.  Every muscle in his body hurts

## 2013-04-15 NOTE — Telephone Encounter (Signed)
Called patient and patient stated that he would like to know if Dr. Terrace ArabiaYan would prescribe Tizanidine for his muscle spasms that he is having really bad. Please advise

## 2013-05-15 ENCOUNTER — Encounter (INDEPENDENT_AMBULATORY_CARE_PROVIDER_SITE_OTHER): Payer: Self-pay

## 2013-05-15 ENCOUNTER — Ambulatory Visit (INDEPENDENT_AMBULATORY_CARE_PROVIDER_SITE_OTHER): Payer: Managed Care, Other (non HMO) | Admitting: Neurology

## 2013-05-15 ENCOUNTER — Encounter: Payer: Self-pay | Admitting: Neurology

## 2013-05-15 DIAGNOSIS — R259 Unspecified abnormal involuntary movements: Secondary | ICD-10-CM

## 2013-05-15 DIAGNOSIS — G243 Spasmodic torticollis: Secondary | ICD-10-CM

## 2013-05-15 DIAGNOSIS — G249 Dystonia, unspecified: Secondary | ICD-10-CM

## 2013-05-15 DIAGNOSIS — G90519 Complex regional pain syndrome I of unspecified upper limb: Secondary | ICD-10-CM

## 2013-05-15 DIAGNOSIS — IMO0002 Reserved for concepts with insufficient information to code with codable children: Secondary | ICD-10-CM

## 2013-05-15 MED ORDER — ONABOTULINUMTOXINA 100 UNITS IJ SOLR
500.0000 [IU] | Freq: Once | INTRAMUSCULAR | Status: AC
  Administered 2013-05-15: 500 [IU] via INTRAMUSCULAR

## 2013-05-15 NOTE — Progress Notes (Signed)
Brandon Lin  is a 43year-old right-handed Caucasian male, has suffered complex regional pain syndrome following a right shoulder injury in 1998, he is here every 3 months for EMG guided Botox injection for his neck, shoulder pain, right upper extremity spasticity.  He fell off a deck and landed on his right neck and shoulder area in 1998, he had recurrent right shoulder pain, later he had multiple surgeries, each procedure making his symptoms worse, he fell off landed on his right shoulder again in 2005, he has developed intractable pain in his right shoulder and neck, dystonic postural, eventually was diagnosed with complex regional pain syndrome. From 2005 to 2007, he suffered severe neck pain, bilateral shoulder pain, abnormal posture of his neck, right upper extremity,to the point of difficult walking, fell many times.  He has been under the care of Dr. Jodi Mourningeynold since 2006, receiving Botox injections every 90 days, which does help his right neck and shoulder pain.  [name] anesthesiologist Dr. Genelle BalSteven Blau, he had significant pain around early 2013, he began to receive inpatient ketamine infusion since March 2013, every 3 months, initially spreading over 10 days with stable daily osage, slowly infusion 4 hours each day, but in August 2014, the infusion was cut back to 5 days, he most recent infusion in January 2015, he only received 3 days infusion.   he is on polypharmacy treatment,  He is taking baclofen.,Neurontin, Ativan 1 mg 3 times a day,  clonazepam, tizanidine, OxyCodone,  dosage is adjusted following his most recent hospital admission [name] in [facility] 2014, currently 30 plus 10 mg every 3 hours, He is also receiving [name]  His father died of leukemia at age 44, mother  is healthy. He is disabled due to his condition. Married, has a son. 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    BOTOX has been very helpful, but he continues to have right neck, left neck and right  shoulder spastic pain, wearing off in 2 months.   UPDATE   May 15 2013  Last injection was in  January 2015, because most recent ketamine infusion was only giving for 3 days instead of previously scheduled 10 days, shortly after the injection, around January and February, he developed frequent painful body muscle jerking movement, we have pedicle adjusted his medications, is now taking regular dose of clonazepam 1 mg 3 times a day, Ativan 1 mg every night, baclofen 20 medical twice a day, Depakote 500 mg every night, gabapentin 600 mg twice a day, oxycodone 30-10 mg every 3 hours, tizanidine 2 mg 2 tablets every 6 hours, his painful muscle spasm has much improved, he scheduled to have repeat IV ketamine infusion in June first [name], his right upper extremity spasticity and pain has much improved with previous injection, most of pain is at  bilateral occipital, cervical, shoulder revision now  Review of Systems  Out of a complete 14 system review, the patient complains of only the following symptoms, and all other reviewed systems are negative.  Kidney stone, low back pain,   Physical Exam  General: Pleasant seated in no acute distress Respiratory: clear to ausculuation bilaterally Cardiovascular: regular rate, rhythm Musculoskeletal: Right upper extremity with dystonia  Neurologic Exam  Cranial Nerves: pupils were equal reactive to light. Visual fields full. EOM's intact. He has no facial muscleweakness. Hearing normal. Tongue and palate move normally Motor: He has neck rotated to right, right tilt, left shoulder elevation, right shoulder in anterior rotation, right elbow flexion and right hand MPI flexion, PPI and DPI extended position Sensory:  allodynia  upon palpitation of neck, shoulder, upper back. Coordination: no dysmetria Gait and Station: tends to lean towards his right side, mild dsytonic posture of his right leg.   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 EMG guided BOTOX A injection,100 units of Botox / 2 cc of normal saline. 500 units of BOTOX was used.    Right superior trapezius  25x2  Units =50  Right splenius cervicus   25 x 6 = 150  Right splenius capitus  25 x2= 50 Right Levator scapulae 25 Right illiocostalis 25     Left levator scapulae   25 Left splenius capitus 25  Left trapezius 25 x2 units= 50  Left splenius cervix 25x4=100   He will come back in  3 months for repeat injections.

## 2013-08-07 ENCOUNTER — Telehealth: Payer: Self-pay | Admitting: Neurology

## 2013-08-07 NOTE — Telephone Encounter (Signed)
Patient's spouse Paula Compton(Karla) requesting Lupita LeashDonna to return call regarding Botox.  Contact # [phone].  thanks

## 2013-08-07 NOTE — Telephone Encounter (Signed)
Called pt and pt stated that [name] had called and took care of the matter for her. I advised that wife that if the pt has any other problems, questions or concerns to call the office. Wife verbalized understanding.

## 2013-08-13 ENCOUNTER — Ambulatory Visit: Payer: Managed Care, Other (non HMO) | Admitting: Neurology

## 2013-08-27 ENCOUNTER — Encounter: Payer: Self-pay | Admitting: Neurology

## 2013-08-27 ENCOUNTER — Ambulatory Visit (INDEPENDENT_AMBULATORY_CARE_PROVIDER_SITE_OTHER): Payer: Managed Care, Other (non HMO) | Admitting: Neurology

## 2013-08-27 DIAGNOSIS — R259 Unspecified abnormal involuntary movements: Secondary | ICD-10-CM

## 2013-08-27 DIAGNOSIS — G90519 Complex regional pain syndrome I of unspecified upper limb: Secondary | ICD-10-CM

## 2013-08-27 DIAGNOSIS — G249 Dystonia, unspecified: Secondary | ICD-10-CM

## 2013-08-27 DIAGNOSIS — G243 Spasmodic torticollis: Secondary | ICD-10-CM

## 2013-08-27 DIAGNOSIS — G241 Genetic torsion dystonia: Secondary | ICD-10-CM

## 2013-08-27 DIAGNOSIS — IMO0002 Reserved for concepts with insufficient information to code with codable children: Secondary | ICD-10-CM

## 2013-08-28 MED ORDER — ONABOTULINUMTOXINA 100 UNITS IJ SOLR
500.0000 [IU] | Freq: Once | INTRAMUSCULAR | Status: AC
  Administered 2013-08-28: 500 [IU] via INTRAMUSCULAR

## 2013-08-28 NOTE — Progress Notes (Signed)
Brandon Lin  is a [age]year-old right-handed Caucasian male, has suffered complex regional pain syndrome following a right shoulder injury in 1998, he is here every 3 months for EMG guided Botox injection for his neck, shoulder pain, right upper extremity spasticity.  He fell off a deck and landed on his right neck and shoulder area in 1998, he had recurrent right shoulder pain, later he had multiple surgeries, each procedure making his symptoms worse, he fell off landed on his right shoulder again in 2005, he has developed intractable pain in his right shoulder and neck, dystonic postural, eventually was diagnosed with complex regional pain syndrome. From 2005 to 2007, he suffered severe neck pain, bilateral shoulder pain, abnormal posture of his neck, right upper extremity,to the point of difficult walking, fell many times.  He has been under the care of Dr. Jodi Mourningeynold since 2006, receiving Botox injections every 90 days, which does help his right neck and shoulder pain.  Also [name]hesiologist Dr. Genelle BalSteven Blau, he had significant pain around early 2013, he began to receive inpatient ketamine infusion since March 2013, every 3 months, initially spreading over 10 days with stable daily osage, slowly infusion 4 hours each day, but in August 2014, the infusion was cut back to 5 days, he most recent infusion in January 2015, he only received 3 days infusion.   he is on polypharmacy treatment,  He is taking baclofen.,Neurontin, Ativan 1 mg 3 times a day,  clonazepam, tizanidine, OxyCodone,  dosage is adjusted following his most recent hospital admission for [facility] management [name] in December 2014, currently 30 plus 10 mg every 3 hours, He is also receiving [facility] ket[facility] every 6 [name]  His father died of leukemia at age 44, mother  is healthy. He is disabled due to his condition. Married, has a son. 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    BOTOX has been very helpful, but he continues to have right neck, left neck and right  shoulder spastic pain, wearing off in 2 months.   He [facility],   There was some changes of his ketamine schedule, he got  3 days iv ketamine in Dec 2014 instead of previously scheduled 10 days, shortly after the injection, around January 2015, he developed frequent painful body muscle jerking movement, we have telephone adjusted his medications, is now taking regular dose of clonazepam 1 mg 3 times a day, Ativan 1 mg every night, baclofen 20 medical twice a day, Depakote 500 mg every night, gabapentin 600 mg twice a day, oxycodone 30-10 mg every 3 hours, tizanidine 2 mg 2 tablets every 6 hours, his painful muscle spasm has much improved, he scheduled to have repeat IV ketamine infusion in June first for 10 days [name] again, his right upper extremity spasticity and pain has much improved with previous injection, most of pain is at  bilateral occipital, cervical, shoulder revision now  UPDATE July 29th 2015: He [facility] his [facility], [facility], which has been very helpful, previous injection has been very helpful too, today his pain is mainly at bilateral neck, shoulder region  He also recently had a kidney stone procedure, recovering well,  He is with his wife at today's clinical visit, who is  also combating metastatic squamous cell cancer,  Review of Systems  Out of a complete 14 system review, the patient complains of only the following symptoms, and all other reviewed systems are negative.  Kidney stone, low back pain,   Physical Exam  General: Pleasant seated in no acute distress Respiratory: clear to ausculuation bilaterally Cardiovascular: regular rate, rhythm Musculoskeletal: Right upper extremity with  dystonia  Neurologic Exam  Cranial Nerves: pupils were equal reactive to light. Visual fields full. EOM's intact. He has no facial muscleweakness. Hearing normal. Tongue and palate move normally Motor: He has neck rotated to right, right tilt, left shoulder elevation, right shoulder in anterior rotation, right elbow flexion and right hand MPI flexion, PPI and DPI extended position Sensory:  allodynia upon palpitation of neck, shoulder, upper back. Coordination: no dysmetria Gait and Station: tends to lean towards his right side, mild dsytonic posture of his right leg.   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 EMG guided BOTOX A injection,100 units of Botox / 2 cc of normal saline. 500 units of BOTOX was used.    Right superior trapezius  25x2  Units =50  Right splenius cervicus   25 x 6 = 150  Right splenius capitus  25 x2= 50 Right Levator scapulae 25 Right illiocostalis 25     Left levator scapulae   25 Left splenius capitus 25  Left trapezius 25 x2 units= 50 Left scales 25 x2=50  Left splenius cervix 25x 2=50   He will come back in  3 months for repeat injections.

## 2013-09-04 NOTE — Telephone Encounter (Signed)
Noted  

## 2013-10-17 ENCOUNTER — Other Ambulatory Visit: Payer: Self-pay | Admitting: Neurology

## 2013-10-17 NOTE — Telephone Encounter (Signed)
Patient calling to request refill of Lorazepam since his script is going to run out over the weekend, please return call and advise. Advised patient to call pharmacy next time.

## 2013-11-12 ENCOUNTER — Encounter: Payer: Self-pay | Admitting: Neurology

## 2013-11-12 ENCOUNTER — Ambulatory Visit (INDEPENDENT_AMBULATORY_CARE_PROVIDER_SITE_OTHER): Payer: Managed Care, Other (non HMO) | Admitting: Neurology

## 2013-11-12 ENCOUNTER — Encounter (INDEPENDENT_AMBULATORY_CARE_PROVIDER_SITE_OTHER): Payer: Self-pay

## 2013-11-12 DIAGNOSIS — G90519 Complex regional pain syndrome I of unspecified upper limb: Secondary | ICD-10-CM

## 2013-11-12 DIAGNOSIS — G243 Spasmodic torticollis: Secondary | ICD-10-CM

## 2013-11-12 DIAGNOSIS — G249 Dystonia, unspecified: Secondary | ICD-10-CM

## 2013-11-12 DIAGNOSIS — IMO0002 Reserved for concepts with insufficient information to code with codable children: Secondary | ICD-10-CM

## 2013-11-12 MED ORDER — CLONAZEPAM 1 MG PO TABS: 1.0000 mg | ORAL_TABLET | Freq: Two times a day (BID) | ORAL | Status: DC

## 2013-11-12 MED ORDER — ONABOTULINUMTOXINA 100 UNITS IJ SOLR
500.0000 [IU] | Freq: Once | INTRAMUSCULAR | Status: AC
  Administered 2013-11-12: 500 [IU] via INTRAMUSCULAR

## 2013-11-12 NOTE — Progress Notes (Signed)
Brandon Lin  is a 43year-old right-handed Caucasian male, has suffered complex regional pain syndrome following a right shoulder injury in 1998, he is here every 3 months for EMG guided Botox injection for his neck, shoulder pain, right upper extremity spasticity.  He fell off a deck and landed on his right neck and shoulder area in 1998, he had recurrent right shoulder pain, later he had multiple surgeries, each procedure making his symptoms worse, he fell off landed on his right shoulder again in 2005, he has developed intractable pain in his right shoulder and neck, dystonic postural, eventually was diagnosed with complex regional pain syndrome. From 2005 to 2007, he suffered severe neck pain, bilateral shoulder pain, abnormal posture of his neck, right upper extremity,to the point of difficult walking, fell many times.  He has been under the care of Dr. Jodi Mourningeynold since 2006, receiving Botox injections every 90 days, which does help his right neck and shoulder pain.  [name] Dr. Genelle BalSteven Blau, he had significant pain around early 2013, he began to receive inpatient ketamine infusion since March 2013, every 3 months, initially spreading over 10 days with stable daily osage, slowly infusion 4 hours each day, but in August 2014, the infusion was cut back to 5 days, he most recent infusion in January 2015, he only received 3 days infusion.   he is on polypharmacy treatment,  He is taking baclofen.,Neurontin, Ativan 1 mg 3 times a day,  clonazepam, tizanidine, OxyCodone,  dosage is adjusted following his most recent hospital admission [name] in [facility] 2014, currently 30 plus 10 mg every 3 hours, He is also receiving [name]  His father died of leukemia at age 44, mother  is healthy. He is disabled due to his condition. Married, has a son. 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    BOTOX has been very helpful, but he continues to have right neck, left neck and right  shoulder spastic pain, wearing off in 2 months.   He [facility] to [facility],   There was some changes of his ketamine schedule, he got  3 days iv ketamine in Dec 2014 instead of previously scheduled 10 days, shortly after the injection, around January 2015, he developed frequent painful body muscle jerking movement, we have telephone adjusted his medications, is now taking regular dose of clonazepam 1 mg 3 times a day, Ativan 1 mg every night, baclofen 20 medical twice a day, Depakote 500 mg every night, gabapentin 600 mg twice a day, oxycodone 30-10 mg every 3 hours, tizanidine 2 mg 2 tablets every 6 hours, his painful muscle spasm has much improved, he scheduled to have repeat IV ketamine infusion in June first [name], his right upper extremity spasticity and pain has much improved with previous injection, most of pain is at  bilateral occipital, cervical, shoulder revision now  UPDATE Oct  2015: Last Botox injection in July 2015 has been helpful, but he has developed left neck weakness, lasting for one month, he is under a lot of stress, his wife is dealing with metastatic squamous cell carcinoma, he has changed to a [name], worry about the possibility of continued ketamine infusion, which has been  very helpful for him.  Review of Systems  Out of a complete 14 system review, the patient complains of only the following symptoms, and all other reviewed systems are negative.  Kidney stone, low back pain,   Physical Exam  General: Pleasant seated in no acute distress Respiratory: clear to ausculuation bilaterally Cardiovascular: regular rate, rhythm Musculoskeletal: Right upper extremity with dystonia  Neurologic Exam   Cranial Nerves: pupils were equal reactive to light. Visual fields full. EOM's intact. He has no facial muscleweakness. Hearing normal. Tongue and palate move normally Motor: He has neck rotated to right, right tilt, left shoulder elevation, right shoulder in anterior rotation, right elbow flexion and right hand MPI flexion, PPI and DPI extended position Sensory:  allodynia upon palpitation of neck, shoulder, upper back. Coordination: no dysmetria Gait and Station: tends to lean towards his right side, mild dsytonic posture of his right leg.   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 EMG guided BOTOX A injection,100 units of Botox / 2 cc of normal saline. 500 units of BOTOX was used.    Right superior trapezius  25x2  Units =50  Right splenius cervicus   25 x 2 = 50  Right splenius capitus  25   Right Levator scapulae 25  Right rhomboid 25 x2= 50    Left levator scapulae   25 Left splenius capitus 25   Left upper trapezius 25 x2 units= 50 Left teres major 25x2= 50 Left latissimus dorsi 25  Left splenius cervix 25x 3= 75 Left rhomboid 25 2=50    He will come back in  3 months for repeat injections.

## 2013-12-22 ENCOUNTER — Telehealth: Payer: Self-pay | Admitting: Neurology

## 2013-12-22 NOTE — Telephone Encounter (Signed)
-----   Message from Eugenie BirksJanisha R Cooper sent at 12/22/2013  9:56 AM EST ----- This patient was denied botox due to the high dosage amount. They state that it exceeds the usual recommended dosage. Please provide a letter stating the explanation so that I can attempt to get it approved.

## 2013-12-22 NOTE — Telephone Encounter (Signed)
[name]:  550 Units of BOTOX for which month?  Can we ask insurance for 600  Units BOTOX to make sure that he has enough before next injection

## 2013-12-22 NOTE — Telephone Encounter (Signed)
-----   Message from Eugenie BirksJanisha R Cooper sent at 12/19/2013  9:00 AM EST ----- This patients insurance denied him for the 550 units requested for botox. I have resubmitted with [employer] for 400units. Which is what the orginial approval was for.

## 2013-12-22 NOTE — Telephone Encounter (Signed)
Denese KillingsJanisha, letter of medical necessity is done.

## 2014-01-13 ENCOUNTER — Telehealth: Payer: Self-pay | Admitting: Neurology

## 2014-01-13 NOTE — Telephone Encounter (Signed)
-----   Message from Eugenie BirksJanisha R Cooper sent at 01/12/2014  3:13 PM EST ----- [employer] will not approve an authorization for this patient due to the dosage that he has been receiving. We have submitted a letter and that was denied as well. If you would like to do a one on one with the physician there the number is [phone]. Please advise.

## 2014-01-13 NOTE — Telephone Encounter (Signed)
Brandon KillingsJanisha, please connected, and Skype me for me to talk with physician,   Also please let me know detail, what is approved and what is not approved.

## 2014-01-19 ENCOUNTER — Other Ambulatory Visit: Payer: Self-pay | Admitting: Neurology

## 2014-01-19 NOTE — Telephone Encounter (Signed)
I have talked with Dr. Allie Dimmersborn, He is approved for 500 units of BOTOX injection every 3 months.

## 2014-02-24 ENCOUNTER — Encounter: Payer: Self-pay | Admitting: *Deleted

## 2014-02-25 ENCOUNTER — Encounter: Payer: Self-pay | Admitting: Neurology

## 2014-02-25 ENCOUNTER — Ambulatory Visit (INDEPENDENT_AMBULATORY_CARE_PROVIDER_SITE_OTHER): Payer: Managed Care, Other (non HMO) | Admitting: Neurology

## 2014-02-25 VITALS — BP 155/94 | HR 89

## 2014-02-25 DIAGNOSIS — G90519 Complex regional pain syndrome I of unspecified upper limb: Secondary | ICD-10-CM | POA: Insufficient documentation

## 2014-02-25 DIAGNOSIS — G249 Dystonia, unspecified: Secondary | ICD-10-CM | POA: Diagnosis not present

## 2014-02-25 DIAGNOSIS — G243 Spasmodic torticollis: Secondary | ICD-10-CM | POA: Diagnosis not present

## 2014-02-25 DIAGNOSIS — G90513 Complex regional pain syndrome I of upper limb, bilateral: Secondary | ICD-10-CM

## 2014-02-25 MED ORDER — ONABOTULINUMTOXINA 100 UNITS IJ SOLR
500.0000 [IU] | Freq: Once | INTRAMUSCULAR | Status: AC
  Administered 2014-02-25: 500 [IU] via INTRAMUSCULAR

## 2014-02-25 NOTE — Addendum Note (Signed)
Addended by: Levert FeinsteinYAN, Adriana Lina on: 02/25/2014 03:26 PM   Modules accepted: Orders

## 2014-02-25 NOTE — Progress Notes (Signed)
Brandon Lin  is a [age]year-old right-handed Caucasian male, has suffered complex regional pain syndrome following a right shoulder injury in 1998, he is here every 3 months for EMG guided Botox injection for his neck, shoulder pain, right upper extremity spasticity.  He fell off a deck and landed on his right neck and shoulder area in 1998, he had recurrent right shoulder pain, later he had multiple surgeries, each procedure making his symptoms worse, he fell off landed on his right shoulder again in 2005, he has developed intractable pain in his right shoulder and neck, dystonic postural, eventually was diagnosed with complex regional pain syndrome. From 2005 to 2007, he suffered severe neck pain, bilateral shoulder pain, abnormal posture of his neck, right upper extremity,to the point of difficult walking, fell many times.  He has been under the care of Dr. Jodi Mourningeynold since 2006, receiving Botox injections every 90 days, which does help his right neck and shoulder pain.  Also [name] anesthesiologist Dr. Genelle BalSteven Blau, he had significant pain around early 2013, he began to receive inpatient ketamine infusion since March 2013, every 3 months, initially spreading over 10 days with stable daily osage, slowly infusion 4 hours each day, but in August 2014, the infusion was cut back to 5 days, he most recent infusion in January 2015, he only received 3 days infusion.  He is on polypharmacy treatment,  He is taking baclofen.,Neurontin, Ativan 1 mg 3 times a day,  clonazepam, tizanidine, OxyCodone,  dosage is adjusted following his most recent hospital admission [name] in [facility] 2014, currently 30 plus 10 mg every 3 hours, He is also receiving IV ket[facility] 6 [name]  His father died of leukemia at age 45, mother  is healthy. He is disabled due to his condition. Married, has a son. 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    BOTOX has been very helpful, but he continues to have right neck, left neck and right  shoulder spastic pain, wearing off in 2 months.   He continues to get [facility],   There was some changes of his ketamine schedule, he got  3 days iv ketamine in Dec 2014 instead of previously scheduled 10 days, shortly after the injection, around January 2015, he developed frequent painful body muscle jerking movement, we have telephone adjusted his medications, is now taking regular dose of clonazepam 1 mg 3 times a day, Ativan 1 mg every night, baclofen 20 medical twice a day, Depakote 500 mg every night, gabapentin 600 mg twice a day, oxycodone 30-10 mg every 3 hours, tizanidine 2 mg 2 tablets every 6 hours, his painful muscle spasm has much improved, he scheduled to have repeat IV ketamine infusion in June first for 10 days [name] again, his right upper extremity spasticity and pain has much improved with previous injection, most of pain is at  bilateral occipital, cervical, shoulder revision now  UPDATE Oct  2015: Last Botox injection in July 2015 has been helpful, but he has developed left neck weakness, lasting for one month, he is under a lot of stress, his wife is dealing with metastatic squamous cell carcinoma, he has changed to a new [name], worry about the possibility of continued ketamine infusion, which has been very  helpful for him.  UPDATE Jan 27th 2016: Last injection was in October 2015, which has been helpful, today he complains of intermittent right more than left upper extremity muscle spasm, right hand finger spasm, tenderness, bilateral neck, shoulder pain.  Review of Systems  Out of a complete 14 system review, the patient complains of only the following symptoms, and all other reviewed systems are negative.  Kidney stone,  low back pain,   Physical Exam  General: Pleasant seated in no acute distress Respiratory: clear to ausculuation bilaterally Cardiovascular: regular rate, rhythm Musculoskeletal: Right upper extremity with dystonia  Neurologic Exam  Cranial Nerves: pupils were equal reactive to light. Visual fields full. EOM's intact. He has no facial muscleweakness. Hearing normal. Tongue and palate move normally Motor: He has neck rotated to right, right tilt, left shoulder elevation, right shoulder in anterior rotation, right elbow flexion and right hand MPI flexion, PPI and DPI extended position Sensory:  allodynia upon palpitation of neck, shoulder, upper back. Coordination: no dysmetria Gait and Station: tends to lean towards his right side, mild dsytonic posture of his right leg.   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 EMG guided BOTOX A injection,  Use 500 units   Right flexor digitorum superficialis 25 units Right flexor digitorum profundus 25 Right lumbricals 37.5 Right pronator teres 25 Right palmaris longus 12.5 units.  Right superior trapezius  25x2  Units =50  Right splenius cervicus   25 x 2 = 50  Right splenius capitus  25   Right Levator scapulae 25 Left levator scapulae   25 Left splenius capitus 25   Left upper trapezius 25 units Left splenius cervix 25 x2= 50   He will come back in  3 months for repeat injections.  Brandon Lin, M.D. Ph.D.  [facility] [address] [place], [place] [address] Phone: [phone] Fax:      [phone]

## 2014-03-17 ENCOUNTER — Other Ambulatory Visit: Payer: Self-pay

## 2014-03-17 MED ORDER — LORAZEPAM 1 MG PO TABS: ORAL_TABLET | ORAL | Status: DC

## 2014-03-17 MED ORDER — TIZANIDINE HCL 2 MG PO TABS: 4.0000 mg | ORAL_TABLET | Freq: Four times a day (QID) | ORAL | Status: DC | PRN

## 2014-03-17 MED ORDER — CLONAZEPAM 1 MG PO TABS: 1.0000 mg | ORAL_TABLET | Freq: Two times a day (BID) | ORAL | Status: DC

## 2014-03-18 NOTE — Telephone Encounter (Signed)
Rx signed and faxed.

## 2014-03-20 ENCOUNTER — Other Ambulatory Visit: Payer: Self-pay | Admitting: Neurology

## 2014-05-26 ENCOUNTER — Encounter: Payer: Self-pay | Admitting: *Deleted

## 2014-05-27 ENCOUNTER — Ambulatory Visit (INDEPENDENT_AMBULATORY_CARE_PROVIDER_SITE_OTHER): Payer: Managed Care, Other (non HMO) | Admitting: Neurology

## 2014-05-27 ENCOUNTER — Encounter: Payer: Self-pay | Admitting: Neurology

## 2014-05-27 VITALS — BP 143/84 | HR 77 | Ht 68.0 in | Wt 274.0 lb

## 2014-05-27 DIAGNOSIS — G90513 Complex regional pain syndrome I of upper limb, bilateral: Secondary | ICD-10-CM | POA: Diagnosis not present

## 2014-05-27 DIAGNOSIS — G243 Spasmodic torticollis: Secondary | ICD-10-CM

## 2014-05-27 DIAGNOSIS — G249 Dystonia, unspecified: Secondary | ICD-10-CM

## 2014-05-27 NOTE — Progress Notes (Signed)
Brandon Lin  is a 44 year-old right-handed Caucasian male, has suffered complex regional pain syndrome following a right shoulder injury in 1998, he is here every 3 months for EMG guided Botox injection for his neck, shoulder pain, right upper extremity spasticity.  He fell off a deck and landed on his right neck and shoulder area in 1998, he had recurrent right shoulder pain, later he had multiple surgeries, each procedure making his symptoms worse, he fell off landed on his right shoulder again in 2005, he has developed intractable pain in his right shoulder and neck, dystonic postural, eventually was diagnosed with complex regional pain syndrome. From 2005 to 2007, he suffered severe neck pain, bilateral shoulder pain, abnormal posture of his neck, right upper extremity,to the point of difficult walking, fell many times.  He has been under the care of Dr. Jodi Mourningeynold since 2006, receiving Botox injections every 90 days, which does help his right neck and shoulder pain.  [name]hesiologist Dr. Genelle BalSteven Blau, he had significant pain around early 2013, he began to receive inpatient ketamine infusion since March 2013, every 3 months, initially spreading over 10 days with stable daily osage, slowly infusion 4 hours each day, but in August 2014, the infusion was cut back to 5 days, he most recent infusion in January 2015, he only received 3 days infusion.  He is on polypharmacy treatment,  He is taking baclofen.,Neurontin, Ativan 1 mg 3 times a day,  clonazepam, tizanidine, OxyCodone,  dosage is adjusted following his most recent hospital admission [name] in December 2014, currently 30 plus 10 mg every 3 hours, He is also receiving IV ketamine [facility] 6 [name]  His father died of leukemia at age 45, mother  is healthy. He is disabled due to his condition. Married, has a son. 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    BOTOX has been very helpful, but he continues to have right neck, left neck and right  shoulder spastic pain, wearing off in 2 months.   He continues to get [facility],   There was some changes of his ketamine schedule, he got  3 days iv ketamine in Dec 2014 instead of previously scheduled 10 days, shortly after the injection, around January 2015, he developed frequent painful body muscle jerking movement, we have telephone adjusted his medications, is now taking regular dose of clonazepam 1 mg 3 times a day, Ativan 1 mg every night, baclofen 20 medical twice a day, Depakote 500 mg every night, gabapentin 600 mg twice a day, oxycodone 30-10 mg every 3 hours, tizanidine 2 mg 2 tablets every 6 hours, his painful muscle spasm has much improved, he scheduled to have repeat IV ketamine infusion in June first for 10 days [name] again, his right upper extremity spasticity and pain has much improved with previous injection, most of pain is at  bilateral occipital, cervical, shoulder revision now  UPDATE Oct  2015: Last Botox injection in July 2015 has been helpful, but he has developed left neck weakness, lasting for one month, he is under a lot of stress, his wife is dealing with metastatic squamous cell carcinoma, he has changed to [name], worry about the possibility of continued ketamine infusion, which has been  very helpful for him.  UPDATE Jan 27th 2016: Last injection was in October 2015, which has been helpful, today he complains of intermittent right more than left upper extremity muscle spasm, right hand finger spasm, tenderness, bilateral neck, shoulder pain.  UPDATE April 27th 2016: He is alone at today's visit, his wife is at [facility], has recurrent oral cancer, he responded very well to previous injection, he can relax  his right hand much better, but has developed transient weakness, now has recovered  Review of Systems  Out of a complete 14 system review, the patient complains of only the following symptoms, and all other reviewed systems are negative.  Kidney stone, low back pain,   Physical Exam  General: Pleasant seated in no acute distress Respiratory: clear to ausculuation bilaterally Cardiovascular: regular rate, rhythm Musculoskeletal: Right upper extremity with dystonia  Neurologic Exam  Cranial Nerves: pupils were equal reactive to light. Visual fields full. EOM's intact. He has no facial muscleweakness. Hearing normal. Tongue and palate move normally Motor: He has neck rotated to right, right tilt, left shoulder elevation, right shoulder in anterior rotation, right elbow flexion and right hand MPI flexion, PPI and DPI extended position Sensory:  allodynia upon palpitation of neck, shoulder, upper back. Coordination: no dysmetria Gait and Station: tends to lean towards his right side, mild dsytonic posture of his right leg.   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  *Botox NDC [id number], Lot [id number], Exp 09/2016   Brandon Lin, M.D. Ph.D.  [facility] [address] [place], [place] [address] Phone: [phone] Fax:      [phone]

## 2014-09-01 ENCOUNTER — Telehealth: Payer: Self-pay | Admitting: Neurology

## 2014-09-01 NOTE — Telephone Encounter (Signed)
Pt called to schedule botox, please call and advise [phone]

## 2014-09-03 NOTE — Telephone Encounter (Signed)
Patient calling back regarding botox, please call

## 2014-09-03 NOTE — Telephone Encounter (Signed)
LVM for patient to call office to schedule Botox appt.

## 2014-09-07 NOTE — Telephone Encounter (Signed)
Spoke to spouse. Advised prior auth for Botox expired 07/17/14. We have resubmitted renewal. Spouse verbalized understanding. Will call by the end of the week or earlier w/ status.

## 2014-09-07 NOTE — Telephone Encounter (Signed)
Wife is calling back regarding botox, denied because it has to be renewed every 6 months, has to be approved for medical necessity

## 2014-09-07 NOTE — Telephone Encounter (Signed)
Patient is calling back about botox, he says he really needs to get in to get botox

## 2014-09-07 NOTE — Telephone Encounter (Signed)
Called patient to let him know his prior-auth for Botox had expired and we are working to get it renewed. Patient verbalized understanding.

## 2014-09-14 NOTE — Telephone Encounter (Signed)
Brandon Lin/Wife returned your call

## 2014-09-15 NOTE — Telephone Encounter (Signed)
Returned call. Spoke to patient. Advised Botox in appeal status.

## 2014-09-21 NOTE — Telephone Encounter (Signed)
Called [employer]. Rep stated the prior auth dept had not noted appeal info received. Resubmitted paperwork marked urgent.

## 2014-09-25 NOTE — Telephone Encounter (Signed)
Carla/Wife called re Botox and going round and round with insurance. [name] thinks botox is cosmetic and denied. Requested peer to peer review. Wife is very frustrated, feels this is now urgent and that this has been going on a long time. Please call patient back [phone].

## 2014-09-29 NOTE — Telephone Encounter (Signed)
Called patient and spouse and informed spoke to [place], was advised Botox is still in appeal process, peer to peer has not been requested. Advised that I asked what could we do on the physician side to speed up this process and was told all material has been submitted, waiting on [employer] medical review team to make a decision. Patient verbalized understanding.

## 2014-09-29 NOTE — Telephone Encounter (Signed)
I have called the patient to let him know we would call [employer] to see where things are at with the appeal.  He states his insurance company told him on 09/28/14 they will require a peer-to-peer with Dr. Terrace Arabia. If this is the case, she will need the contact information to complete the peer-to-peer review.  I told the patient we would work on this problem and get him scheduled as soon as possible.

## 2014-09-29 NOTE — Telephone Encounter (Signed)
Patient called regarding Botox. [name] is requesting peer to peer review. Patient needs botox injection "muscles are going crazy". Last botox was in May, usually gets botox every 90 days.

## 2014-10-06 NOTE — Telephone Encounter (Signed)
Patient called stating botox was approved 10/01/14. He is inquiring when appt can be made. Patient states he called this morning and has not rec'd a call back. I do not see telephone documentation to that. Please call and advise. Patient can be reached at [phone].

## 2014-10-07 NOTE — Addendum Note (Signed)
Addended by: Doree Barthel on: 10/07/2014 10:17 AM   Modules accepted: Medications

## 2014-10-07 NOTE — Telephone Encounter (Signed)
Spoke to patient. Advised aware of approval. Called in new Rx for 600units to [facility]. Rx still processing. Will call pharmacy at the end of the day to check status. Patient agreed and was grateful.

## 2014-10-12 NOTE — Telephone Encounter (Signed)
Called to check status of Rx. Rep from [facility] states Rx still processing. She requested again that Rx is expedited. Will check status 10/13/14 morning, if no change in status by the eod.

## 2014-10-13 NOTE — Telephone Encounter (Signed)
Patient was on the phone with Lucille Passy and call was disconnected. Please call patient back [phone].

## 2014-10-13 NOTE — Telephone Encounter (Signed)
Pt spoke with Dr. Zannie Cove nurse scheduled appt.

## 2014-10-13 NOTE — Telephone Encounter (Signed)
I was speaking with the patient and the line was disconnected. Attempted to call back twice but unable to reach patient. Will call after I speak with Dr. Zannie Cove nurse to coordinate appt for asap hopefully.

## 2014-10-13 NOTE — Telephone Encounter (Signed)
I have not called this patient.  This note is regarding Botox, will forward to clinic to see if perhaps they were trying to reach Mr Vineyard.

## 2014-10-15 ENCOUNTER — Ambulatory Visit (INDEPENDENT_AMBULATORY_CARE_PROVIDER_SITE_OTHER): Payer: Managed Care, Other (non HMO) | Admitting: Neurology

## 2014-10-15 ENCOUNTER — Other Ambulatory Visit: Payer: Self-pay

## 2014-10-15 ENCOUNTER — Telehealth: Payer: Self-pay | Admitting: Neurology

## 2014-10-15 ENCOUNTER — Encounter: Payer: Self-pay | Admitting: Neurology

## 2014-10-15 VITALS — BP 134/84 | HR 75 | Ht 68.0 in | Wt 292.5 lb

## 2014-10-15 DIAGNOSIS — G249 Dystonia, unspecified: Secondary | ICD-10-CM

## 2014-10-15 DIAGNOSIS — G90519 Complex regional pain syndrome I of unspecified upper limb: Secondary | ICD-10-CM | POA: Diagnosis not present

## 2014-10-15 DIAGNOSIS — G243 Spasmodic torticollis: Secondary | ICD-10-CM | POA: Diagnosis not present

## 2014-10-15 MED ORDER — LIDOCAINE HCL 2 % EX GEL: Freq: Every day | CUTANEOUS | Status: DC

## 2014-10-15 MED ORDER — CLONAZEPAM 1 MG PO TABS: 1.0000 mg | ORAL_TABLET | Freq: Two times a day (BID) | ORAL | Status: DC

## 2014-10-15 MED ORDER — LORAZEPAM 1 MG PO TABS: ORAL_TABLET | ORAL | Status: DC

## 2014-10-15 MED ORDER — LIDOCAINE 4 % EX GEL: 2.0000 g | Freq: Every day | CUTANEOUS | Status: DC

## 2014-10-15 NOTE — Telephone Encounter (Signed)
Lidocaine gel works better, ask pharmacy which kind is available, we can change to what ever is available at their pharmacy

## 2014-10-15 NOTE — Progress Notes (Signed)
PATIENT: Brandon Lin DOB: 09/12/1970  Chief Complaint  Patient presents with  . Cervical Dystonia    Botox 600units - Specialty Pharmacy  . Complex Regional Pain Syndrome     HISTORICAL  Brandon Lin,   Rithy  is a 44year-old right-handed Caucasian male, has suffered complex regional pain syndrome following a right shoulder injury in 1998, he is here every 3 months for EMG guided Botox injection for his neck, shoulder pain, right upper extremity spasticity.  He fell off a deck and landed on his right neck and shoulder area in 1998, he had recurrent right shoulder pain, later he had multiple surgeries, each procedure making his symptoms worse, he fell off landed on his right shoulder again in 2005, he has developed intractable pain in his right shoulder and neck, dystonic postural, eventually was diagnosed with complex regional pain syndrome. From 2005 to 2007, he suffered severe neck pain, bilateral shoulder pain, abnormal posture of his neck, right upper extremity,to the point of difficult walking, fell many times.  He has been under the care of Dr. Jodi Mourning since 2006, receiving Botox injections every 90 days, which does help his right neck and shoulder pain.  Also under pain management at [facility] anesthesiologist Dr. Genelle Bal, he had significant pain around early 2013, he began to receive inpatient ketamine infusion since March 2013, every 3 months, initially spreading over 10 days with stable daily osage, slowly infusion 4 hours each day, but in August 2014, the infusion was cut back to 5 days, he most recent infusion in January 2015, he only received 3 days infusion.  He is on polypharmacy treatment,  He is taking baclofen.,Neurontin, Ativan 1 mg 3 times a day,  clonazepam, tizanidine, OxyCodone,  dosage is adjusted following his most recent hospital admission for pain management at [facility] in December 2014, currently 30 plus 10 mg every 3 hours, He is also  receiving IV ketamine treatment every 6 months at [facility]  His father died of leukemia at age 15, mother  is healthy. He is disabled due to his condition. Married, has a son. 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    BOTOX has been very helpful, but he continues to have right neck, left neck and right  shoulder spastic pain, wearing off in 2 months.   He continues to get ketamine infusion from [facility],   There was some changes of his ketamine schedule, he got  3 days iv ketamine in Dec 2014 instead of previously scheduled 10 days, shortly after the injection, around January 2015, he developed frequent painful body muscle jerking movement, we have telephone adjusted his medications, is now taking regular dose of clonazepam 1 mg 3 times a day, Ativan 1 mg every night, baclofen 20 medical twice a day, Depakote 500 mg every night, gabapentin 600 mg twice a day, oxycodone 30-10 mg every 3 hours, tizanidine 2 mg 2 tablets every 6 hours, his painful muscle spasm has much improved, he scheduled to have repeat IV ketamine infusion in June first for 10 days at [facility] again, his right upper extremity spasticity and pain has much improved with previous injection, most of pain is at  bilateral occipital, cervical, shoulder revision now  UPDATE Oct  2015: Last Botox injection in July 2015 has been helpful, but he has developed left  neck weakness, lasting for one month, he is under a lot of stress, his wife is dealing with metastatic squamous cell carcinoma, he has changed to a new physician at [facility], worry about the possibility of continued ketamine infusion, which has been very helpful for him.  UPDATE Jan 27th 2016: Last injection was in October 2015, which has been helpful, today he complains of intermittent right more than left upper extremity muscle spasm, right  hand finger spasm, tenderness, bilateral neck, shoulder p He also takes care of his wife, who has suffered metastatic cancer,  REVIEW OF SYSTEMS: Full 14 system review of systems performed and notable only for as above  ALLERGIES: No Known Allergies  HOME MEDICATIONS: Current Outpatient Prescriptions  Medication Sig Dispense Refill  . ANUCORT-HC 25 MG suppository 25 mg.    . baclofen (LIORESAL) 20 MG tablet Take 20 mg by mouth 2 (two) times daily.    . clonazePAM (KLONOPIN) 1 MG tablet Take 1 tablet (1 mg total) by mouth 2 (two) times daily. 180 tablet 1  . gentamicin (GARAMYCIN) 0.3 % ophthalmic solution     . ketorolac (TORADOL) 10 MG tablet 10 mg daily.    Marland Kitchen LORazepam (ATIVAN) 1 MG tablet TAKE ONE TABLET BY MOUTH UP TO 3 TIMES DAILY 270 tablet 1  . OnabotulinumtoxinA (BOTOX IJ) Inject 500 Units as directed.    . OxyCODONE HCl (ROXICODONE PO) Take by mouth as directed.    . pregabalin (LYRICA) 100 MG capsule Take 100 mg by mouth 3 (three) times daily.    . TESTOSTERONE IM Inject 400 Units into the muscle every 30 (thirty) days.    Marland Kitchen tiZANidine (ZANAFLEX) 2 MG tablet Take 2 tablets (4 mg total) by mouth every 6 (six) hours as needed for muscle spasms. 720 tablet 1  . XARELTO 20 MG TABS tablet      No current facility-administered medications for this visit.    PAST MEDICAL HISTORY: Past Medical History  Diagnosis Date  . Falls   . Dystonia   . Insomnia w/ sleep apnea   . Other fragments of torsion dystonia   . Complex regional pain syndrome 04/23/2012  . Dystonia 04/23/2012    PAST SURGICAL HISTORY: History reviewed. No pertinent past surgical history.  FAMILY HISTORY: Family History  Problem Relation Age of Onset  . Healthy Mother     SOCIAL HISTORY:  Social History   Social History  . Marital Status: Divorced    Spouse Name: N/A  . Number of Children: N/A  . Years of Education: N/A   Occupational History  . Not on file.   Social History Main Topics  .  Smoking status: Former Games developer  . Smokeless tobacco: Never Used     Comment: quit 18 years ago  . Alcohol Use: No  . Drug Use: No  . Sexual Activity: Not on file   Other Topics Concern  . Not on file   Social History Narrative     PHYSICAL EXAM   Filed Vitals:   10/15/14 1317  BP: 134/84  Pulse: 75  Height: 5\' 8"  (1.727 m)  Weight: 292 lb 8 oz (132.677 kg)    Not recorded      Body mass index is 44.48 kg/(m^2).    Physical Exam  General: Pleasant seated in no acute distress Respiratory: clear to ausculuation bilaterally Cardiovascular: regular rate, rhythm Musculoskeletal: Right upper extremity with dystonia  Neurologic Exam  Cranial Nerves: pupils were equal reactive to light. Visual fields full.  EOM's intact. He has no facial muscleweakness. Hearing normal. Tongue and palate move normally Motor: He has neck rotated to right, right tilt, left shoulder elevation, right shoulder in anterior rotation, right elbow flexion and right hand MPI flexion, PPI and DPI extended position Sensory:  allodynia upon palpitation of neck, shoulder, upper back. Coordination: no dysmetria Gait and Station: tends to lean towards his right side, mild dsytonic posture of his right leg.   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 EMG guided BOTOX A injection,  Use 600 units of Botox a . Right levator scapulae 25 units Right upper trapezius 25 times 2= 50 units Right rhomboid 25x2=50 units Left Rhomboid 25 x2= 50 units.  Right teres major 25 unitsx2=50 Right latissimus dorsi 25 units x2=50 units Right superior trapezius  25x2  Units =50  Right splenius cervicus   25 x 4 = 100  Right splenius capitus  25   Right Levator scapulae 25  Left levator scapulae   25 Left splenius capitus 25   Left upper trapezius 25 unit Left splenius cervix 25 x2= 50   He will come back in  3 months for repeat  injections.    Levert Feinstein, M.D. Ph.D.  [facility] [address] [place], [place] [address] Ph: [phone] Fax: [phone]  CC: Referring Provider

## 2014-10-15 NOTE — Progress Notes (Signed)
**  Botox 600units, Lot [id number], Exp 05/2017, [facility] [id number], Specialty Pharmacy**mck,rn

## 2014-10-15 NOTE — Telephone Encounter (Signed)
I called the pharmacy back.  Spoke with Jonny Ruiz.  He said they are able to get the Lidocaine 2% gel, and will be able to proceed with this order.

## 2014-10-15 NOTE — Telephone Encounter (Signed)
Alisha with [facility] called stating they could not get Lidocaine 4 % GEL but could get 4% solution. Please call and advise. She can be reached at [phone]

## 2015-01-12 ENCOUNTER — Telehealth: Payer: Self-pay | Admitting: Neurology

## 2015-01-12 NOTE — Telephone Encounter (Signed)
Pt called and cancelled botox appt. Would like to r/s. Please call [phone]. Thank you

## 2015-01-13 ENCOUNTER — Ambulatory Visit: Payer: Managed Care, Other (non HMO) | Admitting: Neurology

## 2015-01-18 NOTE — Telephone Encounter (Signed)
Called the patient and r/s apt. His medication is already here and he stated that his insurance is not changing in 2017. Requested that I call his wife and speak with her regarding some questions she has about previous apt. Called her and spoke with her regarding some questions she had about the patients procedure such as CPT codes.

## 2015-02-11 ENCOUNTER — Telehealth: Payer: Self-pay

## 2015-02-11 ENCOUNTER — Ambulatory Visit: Payer: Medicare Other | Admitting: Neurology

## 2015-02-11 NOTE — Telephone Encounter (Signed)
Patient canceled Botox appointment the day of appointment.

## 2015-02-12 ENCOUNTER — Encounter: Payer: Self-pay | Admitting: Neurology

## 2015-02-15 ENCOUNTER — Ambulatory Visit: Payer: Managed Care, Other (non HMO) | Admitting: Neurology

## 2015-02-15 NOTE — Telephone Encounter (Signed)
Wife called to cancel BOTOX appointment day of (reschedule from 02/10/14 no show) states patient is not feeling well. Wife would like to speak with Duwayne HeckDanielle, please call [phone].

## 2015-02-15 NOTE — Telephone Encounter (Signed)
Spoke with the patients wife, she stated that the patient was still sick and she would call back to reschedule.

## 2015-02-15 NOTE — Telephone Encounter (Signed)
Spoke with the patients wife and scheduled apt.

## 2015-02-15 NOTE — Telephone Encounter (Signed)
Pt's wife called. She would like 02/22/15 @ 8:30. Please call at [phone]

## 2015-02-16 ENCOUNTER — Encounter: Payer: Self-pay | Admitting: Neurology

## 2015-02-22 ENCOUNTER — Ambulatory Visit (INDEPENDENT_AMBULATORY_CARE_PROVIDER_SITE_OTHER): Payer: Managed Care, Other (non HMO) | Admitting: Neurology

## 2015-02-22 ENCOUNTER — Encounter: Payer: Self-pay | Admitting: Neurology

## 2015-02-22 VITALS — BP 119/76 | HR 87 | Ht 68.0 in | Wt 289.5 lb

## 2015-02-22 DIAGNOSIS — G243 Spasmodic torticollis: Secondary | ICD-10-CM

## 2015-02-22 DIAGNOSIS — G249 Dystonia, unspecified: Secondary | ICD-10-CM | POA: Diagnosis not present

## 2015-02-22 DIAGNOSIS — G90519 Complex regional pain syndrome I of unspecified upper limb: Secondary | ICD-10-CM | POA: Diagnosis not present

## 2015-02-22 MED ORDER — LORAZEPAM 1 MG PO TABS
ORAL_TABLET | ORAL | Status: DC
Start: 2015-02-22 — End: 2015-06-10

## 2015-02-22 MED ORDER — CLONAZEPAM 1 MG PO TABS: 1.0000 mg | ORAL_TABLET | Freq: Two times a day (BID) | ORAL | Status: DC

## 2015-02-22 NOTE — Progress Notes (Signed)
PATIENT: Brandon Lin DOB: 1971/01/18  Chief Complaint  Patient presents with  . Cervical Dystonia    Botox 600units - Specialty Pharmacy  . CRPS    Botox Injections     HISTORICAL  Brandon Lin, is a 45 year-old right-handed Caucasian male, has suffered complex regional pain syndrome following a right shoulder injury in 1998, he is here every 3 months for EMG guided Botox injection for his neck, shoulder pain, right upper extremity spasticity.  He fell off a deck and landed on his right neck and shoulder area in 1998, he had recurrent right shoulder pain, later he had multiple surgeries, each procedure making his symptoms worse, he fell off landed on his right shoulder again in 2005, he has developed intractable pain in his right shoulder and neck, dystonic postural, eventually was diagnosed with complex regional pain syndrome. From 2005 to 2007, he suffered severe neck pain, bilateral shoulder pain, abnormal posture of his neck, right upper extremity,to the point of difficult walking, fell many times.  He has been under the care of Dr. Jodi Mourning since 2006, receiving Botox injections every 90 days, which does help his right neck and shoulder pain.  Also under pain management at [facility] anesthesiologist Dr. Genelle Bal, he had significant pain around early 2013, he began to receive inpatient ketamine infusion since March 2013, every 3 months, initially spreading over 10 days with stable daily osage, slowly infusion 4 hours each day, but in August 2014, the infusion was cut back to 5 days, he most recent infusion in January 2015, he only received 3 days infusion.  He is on polypharmacy treatment,  He is taking baclofen.,Neurontin, Ativan 1 mg 3 times a day,  clonazepam, tizanidine, OxyCodone,  dosage is adjusted following his most recent hospital admission for pain management at [facility] in December 2014, currently 30 plus 10 mg every 3 hours, He is also receiving IV ketamine  treatment every 6 months at [facility]  His father died of leukemia at age 38, mother  is healthy. He is disabled due to his condition. Married, has a son. 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    BOTOX has been very helpful, but he continues to have right neck, left neck and right  shoulder spastic pain, wearing off in 2 months.   He continues to get ketamine infusion from [facility]   There was some changes of his ketamine schedule, he got  3 days iv ketamine in Dec 2014 instead of previously scheduled 10 days, shortly after the injection, around January 2015, he developed frequent painful body muscle jerking movement, we have telephone adjusted his medications, is now taking regular dose of clonazepam 1 mg 3 times a day, Ativan 1 mg every night, baclofen 20 medical twice a day, Depakote 500 mg every night, gabapentin 600 mg twice a day, oxycodone 30-10 mg every 3 hours, tizanidine 2 mg 2 tablets every 6 hours, his painful muscle spasm has much improved, he scheduled to have repeat IV ketamine infusion in June first for 10 days at [facility] again, his right upper extremity spasticity and pain has much improved with previous injection, most of pain is at  bilateral occipital, cervical, shoulder revision now  UPDATE Oct  2015: Last Botox injection in July 2015 has been helpful, but he has developed left neck  weakness, lasting for one month, he is under a lot of stress, his wife is dealing with metastatic squamous cell carcinoma, he has changed to a new physician at [facility], worry about the possibility of continued ketamine infusion, which has been very helpful for him.  UPDATE Jan 27th 2016: Last injection was in October 2015, which has been helpful, today he complains of intermittent right more than left upper extremity muscle spasm, right hand finger spasm,  tenderness, bilateral neck, shoulder He also takes care of his wife, who has suffered metastatic cancer,  UPDATE Feb 22 2015: He is with his wife at today's clinical visit, last EMG guided injection was in September 2016, heel has suffered GI symptoms over the past few weeks, intractable nausea, with mild improvement now, his wife has finished her treatment, is now back to work at [employer]   REVIEW OF SYSTEMS: Full 14 system review of systems performed and notable only for as above  ALLERGIES: No Known Allergies  HOME MEDICATIONS: Current Outpatient Prescriptions  Medication Sig Dispense Refill  . baclofen (LIORESAL) 20 MG tablet Take 20 mg by mouth 2 (two) times daily.    . clonazePAM (KLONOPIN) 1 MG tablet Take 1 tablet (1 mg total) by mouth 2 (two) times daily. 180 tablet 5  . LORazepam (ATIVAN) 1 MG tablet TAKE ONE TABLET BY MOUTH UP TO 3 TIMES DAILY 270 tablet 3  . OnabotulinumtoxinA (BOTOX IJ) Inject 500 Units as directed.    . OxyCODONE HCl (ROXICODONE PO) Take by mouth as directed.    . pregabalin (LYRICA) 150 MG capsule Take 150 mg by mouth 3 (three) times daily.    . TESTOSTERONE IM Inject 400 Units into the muscle every 30 (thirty) days.    Marland Kitchen tiZANidine (ZANAFLEX) 2 MG tablet Take 2 tablets (4 mg total) by mouth every 6 (six) hours as needed for muscle spasms. 720 tablet 1  . XARELTO 20 MG TABS tablet      No current facility-administered medications for this visit.    PAST MEDICAL HISTORY: Past Medical History  Diagnosis Date  . Falls   . Dystonia   . Insomnia w/ sleep apnea   . Other fragments of torsion dystonia   . Complex regional pain syndrome 04/23/2012  . Dystonia 04/23/2012    PAST SURGICAL HISTORY: No past surgical history on file.  FAMILY HISTORY: Family History  Problem Relation Age of Onset  . Healthy Mother     SOCIAL HISTORY:  Social History   Social History  . Marital Status: Divorced    Spouse Name: N/A  . Number of Children: N/A  .  Years of Education: N/A   Occupational History  . Not on file.   Social History Main Topics  . Smoking status: Former Games developer  . Smokeless tobacco: Never Used     Comment: quit 18 years ago  . Alcohol Use: No  . Drug Use: No  . Sexual Activity: Not on file   Other Topics Concern  . Not on file   Social History Narrative     PHYSICAL EXAM   Filed Vitals:   02/22/15 0818  BP: 119/76  Pulse: 87  Height:  (1.727 m)  Weight: 289 lb 8 oz (131.316 kg)    Not recorded      Body mass index is 44.03 kg/(m^2).    Physical Exam  General: Pleasant seated in no acute distress Respiratory: clear to ausculuation bilaterally Cardiovascular: regular rate, rhythm Musculoskeletal: Right upper extremity with  dystonia  Neurologic Exam  Cranial Nerves: pupils were equal reactive to light. Visual fields full. EOM's intact. He has no facial muscleweakness. Hearing normal. Tongue and palate move normally Motor: He has neck rotated to right, right tilt, left shoulder elevation, right shoulder in anterior rotation, right elbow flexion and right hand MPI flexion, PPI and DPI extended position Sensory:  allodynia upon palpitation of neck, shoulder, upper back. Coordination: no dysmetria Gait and Station: tends to lean towards his right side, mild dsytonic posture of his right leg.   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 EMG guided BOTOX A injection,  Use 600 units of Botox A   Right levator scapulae 25 units Right upper trapezius 25 times 2= 50 units Right rhomboid 25x2=50 units Left Rhomboid 25 x2= 50 units.  Right teres major 25 units  Right latissimus dorsi 25 units  Right splenius cervicus   25 x 4= 100 Right splenius capitus  25   Right Levator scapulae 25  Left levator scapulae   25 Left splenius capitus 25   Left upper trapezius 25 unit Left splenius cervix 25 x2= 50  Right brachialis  25x2= 50 units Right palmaris longus 25 units Right flexor digitorum profundus 25 units   He will come back in  3 months for repeat injections. I also refilled his clonazepam, and Ativan today      Levert Feinstein, M.D. Ph.D.  [facility] [address] [place], [place] [address] Ph: [phone] Fax: [phone]  CC: Referring Provider

## 2015-02-22 NOTE — Progress Notes (Signed)
**  Botox 100units x 6, Lot#[id number], Exp 08/2017, [facility] 1914-7829-56, Specialty Pharmacy**mck

## 2015-03-02 ENCOUNTER — Ambulatory Visit: Payer: Managed Care, Other (non HMO) | Admitting: Neurology

## 2015-05-04 ENCOUNTER — Telehealth: Payer: Self-pay | Admitting: Neurology

## 2015-05-04 NOTE — Telephone Encounter (Signed)
Pt would like to make an appt  °

## 2015-05-07 ENCOUNTER — Other Ambulatory Visit: Payer: Self-pay | Admitting: Neurology

## 2015-05-11 NOTE — Telephone Encounter (Signed)
Called patient and left him a VM asking him to return my call.

## 2015-06-07 NOTE — Telephone Encounter (Signed)
Called patient to inform him that the pharmacy was waiting on consent. He did not answer and there was no option for VM. Please have him call [employer] @ [phone].

## 2015-06-08 NOTE — Telephone Encounter (Signed)
Called the pharmacy to set up shipment. They stated that they are still waiting on consent from the patient. Called the patient again and received no answer. His VM was full and unable to receive any messages. Will call back tomorrow.

## 2015-06-09 NOTE — Telephone Encounter (Signed)
Spoke with the patients wife who stated that their copay was over 1000 dollars because we are using speciality pharmacy and medicare will not reimburse any of that money. She wanted to know if there is any way we can change to B/B for future apts. This patient receives 600 total units of botox.

## 2015-06-10 ENCOUNTER — Encounter: Payer: Self-pay | Admitting: Neurology

## 2015-06-10 ENCOUNTER — Ambulatory Visit (INDEPENDENT_AMBULATORY_CARE_PROVIDER_SITE_OTHER): Payer: Medicare Other | Admitting: Neurology

## 2015-06-10 VITALS — BP 140/85 | HR 88 | Ht 68.0 in | Wt 294.6 lb

## 2015-06-10 DIAGNOSIS — G90519 Complex regional pain syndrome I of unspecified upper limb: Secondary | ICD-10-CM

## 2015-06-10 DIAGNOSIS — G243 Spasmodic torticollis: Secondary | ICD-10-CM | POA: Diagnosis not present

## 2015-06-10 MED ORDER — CLONAZEPAM 1 MG PO TABS: 1.0000 mg | ORAL_TABLET | Freq: Two times a day (BID) | ORAL | Status: DC

## 2015-06-10 NOTE — Progress Notes (Signed)
PATIENT: Brandon ChildsRaymond C Lin DOB: 12/03/1970  Chief Complaint  Patient presents with  . CRPS/Cervical Dystonia    Botox 600 units - specialty pharmacy     HISTORICAL  Brandon Lin, is a 45 year-old right-handed Caucasian male, has suffered complex regional pain syndrome following a right shoulder injury in 1998, he is here every 3 months for EMG guided Botox injection for his neck, shoulder pain, right upper extremity spasticity.  He fell off a deck and landed on his right neck and shoulder area in 1998, he had recurrent right shoulder pain, later he had multiple surgeries, each procedure making his symptoms worse, he fell off landed on his right shoulder again in 2005, he has developed intractable pain in his right shoulder and neck, dystonic postural, eventually was diagnosed with complex regional pain syndrome. From 2005 to 2007, he suffered severe neck pain, bilateral shoulder pain, abnormal posture of his neck, right upper extremity,to the point of difficult walking, fell many times.  He has been under the care of Dr. Jodi Mourningeynold since 2006, receiving Botox injections every 90 days, which does help his right neck and shoulder pain.  Also under [name] anesthesiologist Dr. Genelle BalSteven Blau, he had significant pain around early 2013, he began to receive inpatient ketamine infusion since March 2013, every 3 months, initially spreading over 10 days with stable daily osage, slowly infusion 4 hours each day, but in August 2014, the infusion was cut back to 5 days, he most recent infusion in January 2015, he only received 3 days infusion.  He is on polypharmacy treatment,  He is taking baclofen.,Neurontin, Ativan 1 mg 3 times a day,  clonazepam, tizanidine, OxyCodone,  dosage is adjusted following his most recent hospital admission for pain management at [name] in December 2014, currently 30 plus 10 mg every 3 hours, He is also receiving IV ketamine treatment every 6  months [name]  His father died of leukemia at age 46, mother  is healthy. He is disabled due to his condition. Married, has a son. 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    BOTOX has been very helpful, but he continues to have right neck, left neck and right  shoulder spastic pain, wearing off in 2 months.   He continues to get ket[facility] infusion [facility],   There was some changes of his ketamine schedule, he got  3 days iv ketamine in Dec 2014 instead of previously scheduled 10 days, shortly after the injection, around January 2015, he developed frequent painful body muscle jerking movement, we have telephone adjusted his medications, is now taking regular dose of clonazepam 1 mg 3 times a day, Ativan 1 mg every night, baclofen 20 medical twice a day, Depakote 500 mg every night, gabapentin 600 mg twice a day, oxycodone 30-10 mg every 3 hours, tizanidine 2 mg 2 tablets every 6 hours, his painful muscle spasm has much improved, he scheduled to have repeat IV ketamine infusion in June first for 10 days at [name] again, his right upper extremity spasticity and pain has much improved with previous injection, most of pain is at  bilateral occipital, cervical, shoulder revision now  UPDATE Oct  2015: Last Botox injection in July 2015 has been helpful, but he has developed left neck weakness, lasting for one month, he is  under a lot of stress, his wife is dealing with metastatic squamous cell carcinoma, he has changed to a new physician at [facility] worry about the possibility of continued ketamine infusion, which has been very helpful for him.  UPDATE Jan 27th 2016: Last injection was in October 2015, which has been helpful, today he complains of intermittent right more than left upper extremity muscle spasm, right hand finger spasm, tenderness,  bilateral neck, shoulder He also takes care of his wife, who has suffered metastatic cancer,  UPDATE Feb 22 2015: He is with his wife at today's clinical visit, last EMG guided injection was in September 2016, heel has suffered GI symptoms over the past few weeks, intractable nausea, with mild improvement now, his wife has finished her treatment, is now back to work at [employer]  UPDATE May 11th 2017: He saw his pain management physician at [name],will be admitted to [facility] for inpatient pain control, he had multiple recurrent severe back muscle spasm, is now taking narcotics, baclofen, Botox continued to help his pain, he received 600 units of Botox injection in January 2017, no significant side effect noticed.   REVIEW OF SYSTEMS: Full 14 system review of systems performed and notable only for as above  ALLERGIES: No Known Allergies  HOME MEDICATIONS: Current Outpatient Prescriptions  Medication Sig Dispense Refill  . baclofen (LIORESAL) 20 MG tablet Take 20 mg by mouth 2 (two) times daily.    . clonazePAM (KLONOPIN) 1 MG tablet Take 1 tablet (1 mg total) by mouth 2 (two) times daily. 180 tablet 3  . LORazepam (ATIVAN) 1 MG tablet TAKE ONE TABLET BY MOUTH UP TO 3 TIMES DAILY 270 tablet 3  . OnabotulinumtoxinA (BOTOX IJ) Inject 600 Units as directed.     . OxyCODONE HCl (ROXICODONE PO) Take by mouth as directed.    . pregabalin (LYRICA) 150 MG capsule Take 150 mg by mouth 3 (three) times daily.    . TESTOSTERONE IM Inject 400 Units into the muscle every 30 (thirty) days.    Marland Kitchen tiZANidine (ZANAFLEX) 2 MG tablet Take 2 tablets (4 mg total) by mouth every 6 (six) hours as needed for muscle spasms. 720 tablet 1  . XARELTO 20 MG TABS tablet      No current facility-administered medications for this visit.    PAST MEDICAL HISTORY: Past Medical History  Diagnosis Date  . Falls   . Dystonia   . Insomnia w/ sleep apnea   . Other fragments of torsion dystonia   . Complex regional  pain syndrome 04/23/2012  . Dystonia 04/23/2012    PAST SURGICAL HISTORY: No past surgical history on file.  FAMILY HISTORY: Family History  Problem Relation Age of Onset  . Healthy Mother     SOCIAL HISTORY:  Social History   Social History  . Marital Status: Divorced    Spouse Name: N/A  . Number of Children: N/A  . Years of Education: N/A   Occupational History  . Not on file.   Social History Main Topics  . Smoking status: Former Games developer  . Smokeless tobacco: Never Used     Comment: quit 18 years ago  . Alcohol Use: No  . Drug Use: No  . Sexual Activity: Not on file   Other Topics Concern  . Not on file   Social History Narrative     PHYSICAL EXAM   Filed Vitals:   06/10/15 1327  BP: 140/85  Pulse: 88  Height: 5\' 8"  (1.727 m)  Weight: 294 lb 9.6 oz (133.63 kg)    Not recorded      Body mass index is 44.8 kg/(m^2).    Physical Exam  General: Pleasant seated in no acute distress Respiratory: clear to ausculuation bilaterally Cardiovascular: regular rate, rhythm Musculoskeletal: Right upper extremity with dystonia  Neurologic Exam  Cranial Nerves: pupils were equal reactive to light. Visual fields full. EOM's intact. He has no facial muscleweakness. Hearing normal. Tongue and palate move normally Motor: He has neck rotated to right, right tilt, left shoulder elevation, right shoulder in anterior rotation, right elbow flexion and right hand MPI flexion, PPI and DPI extended position Sensory:  allodynia upon palpitation of neck, shoulder, upper back. Coordination: no dysmetria Gait and Station: tends to lean towards his right side, mild dsytonic posture of his right leg.   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 EMG guided BOTOX A injection,  Use 600 units of Botox A   Right levator scapulae 25 units Right upper trapezius 25 times 2= 50 units Right rhomboid 25x2=50  units Left Rhomboid 25 x2= 50 units.  Right teres major 25 units  Right latissimus dorsi 25 units  Right splenius cervicus   25 x 4= 100 Right splenius capitus  25 x3=75 units Right semispinalis 25 units Right iliocostalis muscle 25 units    Right Levator scapulae 25  Left levator scapulae   25 Left splenius capitus 25   Left upper trapezius 25 unit Left splenius cervix 25 x2= 50   He will come back in  3 months for repeat injections. I also refilled his clonazepam, and Ativan today      Levert Feinstein, M.D. Ph.D.  [facility] [address] [place], [place] [address] Ph: [phone] Fax: [phone]  CC: Referring Provider

## 2015-06-10 NOTE — Progress Notes (Signed)
**  Botox 100 units x 6 vials, Lot [id number], Exp 12/2017, NDC 5284-1324-400023-1145-01, specialty pharmacy.**mck,rn.

## 2015-09-07 ENCOUNTER — Other Ambulatory Visit: Payer: Self-pay | Admitting: Neurology

## 2015-09-09 ENCOUNTER — Telehealth: Payer: Self-pay | Admitting: Neurology

## 2015-09-09 ENCOUNTER — Other Ambulatory Visit: Payer: Self-pay | Admitting: Neurology

## 2015-09-09 NOTE — Telephone Encounter (Signed)
Patient requesting refill of clonazePAM (KLONOPIN) 1 MG tablet Pharmacy: [id number] [place], [place] - [address] [facility]

## 2015-09-09 NOTE — Telephone Encounter (Signed)
Dr Lucia GaskinsAhern- FYI  Called and spoke to pharmacist[facility]Mary [facility] Advised I did not receive a return call from [place]. She apologized and stated she thought she called me back. She stated they looked into it further and they did not receive an rx dated 06/10/15 with 5 refills.   Dr Lucia GaskinsAhern gave okay to give verbal order for clonazepam 1mg  1 tablet 2 times daily quantity 60, refills: 5 to pharmacist over the phone. She placed verbal order and will contact pt to them him know.  I gave Dr Trevor MaceAhern's name and DEA number to pharmacist.

## 2015-09-09 NOTE — Telephone Encounter (Signed)
[facility]ed [facility]. Looks like Dr Terrace ArabiaYan printed rx clonazepam on 06/10/15 with their fax. They stated they have not received rx. Pt last received last year.    [facility] Spoke to [place] They stated pt had 1 refill left but it expired. He last filled medication on 08/08/15.  Expired on 08/21/15.  They stated they never got rx dated 06/10/15 with 5 refills.  She thinks they connected old rx with new rx. She is going to check with the pharmacist and call back and let me know. I gave [facility] phone number.

## 2015-09-13 ENCOUNTER — Telehealth: Payer: Self-pay | Admitting: Neurology

## 2015-09-13 NOTE — Telephone Encounter (Signed)
[facility] [phone] called to request shipping address and hours of operation for the shipping of BOTOX. Per Ladora Danielanielle, Jeanette advised [address] [place], Monday-Thursday 8am-5pm, Friday 8am-2pm.

## 2015-09-16 ENCOUNTER — Ambulatory Visit (INDEPENDENT_AMBULATORY_CARE_PROVIDER_SITE_OTHER): Payer: Managed Care, Other (non HMO) | Admitting: Neurology

## 2015-09-16 ENCOUNTER — Encounter: Payer: Self-pay | Admitting: Neurology

## 2015-09-16 VITALS — BP 122/76 | HR 64 | Ht 68.0 in | Wt 286.0 lb

## 2015-09-16 DIAGNOSIS — G243 Spasmodic torticollis: Secondary | ICD-10-CM

## 2015-09-16 DIAGNOSIS — G90519 Complex regional pain syndrome I of unspecified upper limb: Secondary | ICD-10-CM | POA: Diagnosis not present

## 2015-09-16 MED ORDER — ONDANSETRON 4 MG PO TBDP: 4.0000 mg | ORAL_TABLET | Freq: Three times a day (TID) | ORAL | 4 refills | Status: DC | PRN

## 2015-09-16 MED ORDER — CLONAZEPAM 1 MG PO TABS: 1.0000 mg | ORAL_TABLET | Freq: Two times a day (BID) | ORAL | 1 refills | Status: DC

## 2015-09-16 NOTE — Progress Notes (Signed)
**  Botox 100 units x 6 vials, Lot [id number], Exp 03/2018, NDC [id number], specialty pharmacy.**mck,rn.

## 2015-09-16 NOTE — Progress Notes (Signed)
PATIENT: Brandon Lin DOB: [birth year]  Chief Complaint  Patient presents with  . Cervical Dystonia/CRPS    Botox 600 units - specialty pharmacy     HISTORICAL  Brandon Lin, is a 45 year-old right-handed Caucasian male, has suffered complex regional pain syndrome following a right shoulder injury in 1998, he is here every 3 months for EMG guided Botox injection for his neck, shoulder pain, right upper extremity spasticity.  He fell off a deck and landed on his right neck and shoulder area in 1998, he had recurrent right shoulder pain, later he had multiple surgeries, each procedure making his symptoms worse, he fell off landed on his right shoulder again in 2005, he has developed intractable pain in his right shoulder and neck, dystonic postural, eventually was diagnosed with complex regional pain syndrome. From 2005 to 2007, he suffered severe neck pain, bilateral shoulder pain, abnormal posture of his neck, right upper extremity,to the point of difficult walking, fell many times.  He has been under the care of Dr. Jodi Mourning since 2006, receiving Botox injections every 90 days, which does help his right neck and shoulder pain.  Also under [facility] at [facility] anesthesiologist Dr. Genelle Bal, he had significant pain around early 2013, he began to receive inpatient ketamine infusion since March 2013, every 3 months, initially spreading over 10 days with stable daily osage, slowly infusion 4 hours each day, but in August 2014, the infusion was cut back to 5 days, he most recent infusion in January 2015, he only received 3 days infusion.  He is on polypharmacy treatment,  He is taking baclofen.,Neurontin, Ativan 1 mg 3 times a day,  clonazepam, tizanidine, OxyCodone,  dosage is adjusted following his most recent hospital admission for pain management at [facility] in December 2014, currently 30 plus 10 mg every 3 hours, He is also receiving IV ketamine treatment every 6  months at [facility]  His father died of leukemia at age 25, mother  is healthy. He is disabled due to his condition. Married, has a son. 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    BOTOX has been very helpful, but he continues to have right neck, left neck and right  shoulder spastic pain, wearing off in 2 months.   He continues to get ketamine infusion from [facility],   There was some changes of his ketamine schedule, he got  3 days iv ketamine in Dec 2014 instead of previously scheduled 10 days, shortly after the injection, around January 2015, he developed frequent painful body muscle jerking movement, we have telephone adjusted his medications, is now taking regular dose of clonazepam 1 mg 3 times a day, Ativan 1 mg every night, baclofen 20 medical twice a day, Depakote 500 mg every night, gabapentin 600 mg twice a day, oxycodone 30-10 mg every 3 hours, tizanidine 2 mg 2 tablets every 6 hours, his painful muscle spasm has much improved, he scheduled to have repeat IV ketamine infusion in June first for 10 days at [facility] again, his right upper extremity spasticity and pain has much improved with previous injection, most of pain is at  bilateral occipital, cervical, shoulder revision now  UPDATE Oct  2015: Last Botox injection in July 2015 has been helpful, but he has developed left neck weakness, lasting for one month, he is  under a lot of stress, his wife is dealing with metastatic squamous cell carcinoma, he has changed to a new physician at [facility], worry about the possibility of continued ketamine infusion, which has been very helpful for him.  UPDATE Jan 27th 2016: Last injection was in October 2015, which has been helpful, today he complains of intermittent right more than left upper extremity muscle spasm, right hand finger spasm, tenderness,  bilateral neck, shoulder He also takes care of his wife, who has suffered metastatic cancer,  UPDATE Feb 22 2015: He is with his wife at today's clinical visit, last EMG guided injection was in September 2016, heel has suffered GI symptoms over the past few weeks, intractable nausea, with mild improvement now, his wife has finished her treatment, is now back to work at [employer]  UPDATE May 11th 2017: He saw his pain management physician at [name],will be admitted to [facility] for inpatient pain control, he had multiple recurrent severe back muscle spasm, is now taking narcotics, baclofen, Botox continued to help his pain, he received 600 units of Botox injection in January 2017, no significant side effect noticed.  UPDATE August 17th 2017:  I reviewed [facility] visit in Jun 15 2015,he received to right upper extremity interscalene nerve block under ultrasound guidance, which fail to improve his symptoms, actually made his symptoms worse, he was not able to accomplish physical therapy as previously planned.  It is hard to tell result from previous Botox injection in Jun 10 2015,  REVIEW OF SYSTEMS: Full 14 system review of systems performed and notable only for as above  ALLERGIES: No Known Allergies  HOME MEDICATIONS: Current Outpatient Prescriptions  Medication Sig Dispense Refill  . baclofen (LIORESAL) 20 MG tablet Take 20 mg by mouth 2 (two) times daily.    . clonazePAM (KLONOPIN) 1 MG tablet Take 1 tablet (1 mg total) by mouth 2 (two) times daily. 60 tablet 5  . OnabotulinumtoxinA (BOTOX IJ) Inject 600 Units as directed.     . OxyCODONE HCl (ROXICODONE PO) Take by mouth as directed.    . pregabalin (LYRICA) 150 MG capsule Take 150 mg by mouth 3 (three) times daily.    . TESTOSTERONE IM Inject 400 Units into the muscle every 30 (thirty) days.    Marland Kitchen tiZANidine (ZANAFLEX) 2 MG tablet Take 2 tablets (4 mg total) by mouth every 6 (six) hours as needed for muscle spasms. 720 tablet  1  . XARELTO 20 MG TABS tablet      No current facility-administered medications for this visit.     PAST MEDICAL HISTORY: Past Medical History:  Diagnosis Date  . Complex regional pain syndrome 04/23/2012  . Dystonia   . Dystonia 04/23/2012  . Falls   . Insomnia w/ sleep apnea   . Other fragments of torsion dystonia     PAST SURGICAL HISTORY: Past Surgical History:  Procedure Laterality Date  . KNEE SURGERY    . SHOULDER SURGERY      FAMILY HISTORY: Family History  Problem Relation Age of Onset  . Heart disease Mother   . Leukemia Father     SOCIAL HISTORY:  Social History   Social History  . Marital status: Married    Spouse name: N/A  . Number of children: 1  . Years of education: N/A   Occupational History  . Not on file.   Social History Main Topics  . Smoking status: Former Games developer  . Smokeless tobacco: Never Used  Comment: quit 18 years ago  . Alcohol use No  . Drug use: No  . Sexual activity: Not on file   Other Topics Concern  . Not on file   Social History Narrative   Lives ath home w/ his wife     PHYSICAL EXAM   Vitals:   09/16/15 1346  BP: 122/76  Pulse: 64  Weight: 286 lb (129.7 kg)  Height: 5\' 8"  (1.727 m)    Not recorded      Body mass index is 43.49 kg/m.    Physical Exam  General: Pleasant seated in no acute distress Respiratory: clear to ausculuation bilaterally Cardiovascular: regular rate, rhythm Musculoskeletal: Right upper extremity with dystonia  Neurologic Exam  Cranial Nerves: pupils were equal reactive to light. Visual fields full. EOM's intact. He has no facial muscleweakness. Hearing normal. Tongue and palate move normally Motor: He has neck rotated to right, right tilt, left shoulder elevation, right shoulder in anterior rotation, right elbow flexion and right hand MPI flexion, PPI and DPI extended position Sensory:  allodynia upon palpitation of neck, shoulder, upper back. Coordination: no  dysmetria Gait and Station: tends to lean towards his right side, mild dsytonic posture of his right leg.   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 EMG guided BOTOX A injection,  Use 600 units of Botox A   Right levator scapulae 25 units Right upper trapezius 25 times 2= 50 units Right rhomboid 25x2=50 units   Right splenius cervicus   25 x  3=75 Right splenius capitus  25 x2=50 units Right semispinalis 25 units Right iliocostalis 25 units    Left levator scapulae 25 units Left upper trapezius 25 times 2= 50 units Left rhomboid 25x2=50 units   Left splenius cervicus   25 x  3=75 Left splenius capitus  25 x2=50 units Left semispinalis 25 units Left iliocostalis 25 units  He will come back in  3 months for repeat injections. I also refilled his clonazepam, and Ativan today      Levert Feinstein, M.D. Ph.D.  [facility] [address] [place], [place] [address] Ph: [phone] Fax: [phone]  CC: Referring Provider

## 2015-09-21 ENCOUNTER — Encounter: Payer: Self-pay | Admitting: *Deleted

## 2015-09-21 ENCOUNTER — Other Ambulatory Visit: Payer: Self-pay | Admitting: *Deleted

## 2015-09-21 MED ORDER — ONDANSETRON HCL 4 MG PO TABS: 4.0000 mg | ORAL_TABLET | Freq: Three times a day (TID) | ORAL | 3 refills | Status: DC | PRN

## 2015-12-21 ENCOUNTER — Ambulatory Visit: Payer: Managed Care, Other (non HMO)

## 2015-12-30 ENCOUNTER — Ambulatory Visit: Payer: Managed Care, Other (non HMO) | Admitting: Neurology

## 2016-01-06 ENCOUNTER — Ambulatory Visit: Payer: Managed Care, Other (non HMO) | Admitting: Neurology

## 2016-01-06 ENCOUNTER — Telehealth: Payer: Self-pay | Admitting: Neurology

## 2016-01-06 NOTE — Telephone Encounter (Signed)
Pt's wife called said he has been passing kidney stones for about 3 weeks. This morning he started trying to pass one this morning. Said she cannot get him to botox appt today.  Dr Terrace ArabiaYan does not have an available appt until February and she said Dr Terrace ArabiaYan advised to let her know to try to get him worked in. Please call

## 2016-01-06 NOTE — Telephone Encounter (Signed)
Spoke to Brandon ComptonKarla (wife on HIPAA) - his appt has been scheduled for 02/07/15.

## 2016-01-11 NOTE — Telephone Encounter (Signed)
Pt's wife confirmed appt for 12/13/botox.

## 2016-01-12 ENCOUNTER — Encounter: Payer: Self-pay | Admitting: Neurology

## 2016-01-12 ENCOUNTER — Telehealth: Payer: Self-pay | Admitting: Neurology

## 2016-01-12 ENCOUNTER — Ambulatory Visit (INDEPENDENT_AMBULATORY_CARE_PROVIDER_SITE_OTHER): Payer: Managed Care, Other (non HMO) | Admitting: Neurology

## 2016-01-12 VITALS — BP 136/77 | HR 80 | Ht 68.0 in | Wt 290.0 lb

## 2016-01-12 DIAGNOSIS — G243 Spasmodic torticollis: Secondary | ICD-10-CM | POA: Diagnosis not present

## 2016-01-12 DIAGNOSIS — G5640 Causalgia of unspecified upper limb: Secondary | ICD-10-CM

## 2016-01-12 NOTE — Progress Notes (Signed)
PATIENT: Brandon Lin DOB: [birth year]  Chief Complaint  Patient presents with  . CRPS/Cervical Dystonia    Botox 600 units - specialty pharmacy     HISTORICAL  SPENCER DELORENZO, is a 45 year-old right-handed Caucasian male, has suffered complex regional pain syndrome following a right shoulder injury in 1998, he is here every 3 months for EMG guided Botox injection for his neck, shoulder pain, right upper extremity spasticity.  He fell off a deck and landed on his right neck and shoulder area in 1998, he had recurrent right shoulder pain, later he had multiple surgeries, each procedure making his symptoms worse, he fell off landed on his right shoulder again in 2005, he has developed intractable pain in his right shoulder and neck, dystonic postural, eventually was diagnosed with complex regional pain syndrome. From 2005 to 2007, he suffered severe neck pain, bilateral shoulder pain, abnormal posture of his neck, right upper extremity,to the point of difficult walking, fell many times.  He has been under the care of Dr. Jodi Mourning since 2006, receiving Botox injections every 90 days, which does help his right neck and shoulder pain.  Also under pain management at [facility] anesthesiologist Dr. Genelle Bal, he had significant pain around early 2013, he began to receive inpatient ketamine infusion since March 2013, every 3 months, initially spreading over 10 days with stable daily osage, slowly infusion 4 hours each day, but in August 2014, the infusion was cut back to 5 days, he most recent infusion in January 2015, he only received 3 days infusion.  He is on polypharmacy treatment,  He is taking baclofen.,Neurontin, Ativan 1 mg 3 times a day,  clonazepam, tizanidine, OxyCodone,  dosage is adjusted following his most recent hospital admission for pain management at [facility] in December 2014, currently 30 plus 10 mg every 3 hours, He is also receiving IV ketamine treatment every 6  months at [facility]  His father died of leukemia at age 43, mother  is healthy. He is disabled due to his condition. Married, has a son. 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    BOTOX has been very helpful, but he continues to have right neck, left neck and right  shoulder spastic pain, wearing off in 2 months.   He continues to get ketamine infusion from [facility],   There was some changes of his ketamine schedule, he got  3 days iv ketamine in Dec 2014 instead of previously scheduled 10 days, shortly after the injection, around January 2015, he developed frequent painful body muscle jerking movement, we have telephone adjusted his medications, is now taking regular dose of clonazepam 1 mg 3 times a day, Ativan 1 mg every night, baclofen 20 medical twice a day, Depakote 500 mg every night, gabapentin 600 mg twice a day, oxycodone 30-10 mg every 3 hours, tizanidine 2 mg 2 tablets every 6 hours, his painful muscle spasm has much improved, he scheduled to have repeat IV ketamine infusion in June first for 10 days at [facility] again, his right upper extremity spasticity and pain has much improved with previous injection, most of pain is at  bilateral occipital, cervical, shoulder revision now  UPDATE Oct  2015: Last Botox injection in July 2015 has been helpful, but he has developed left neck weakness, lasting for one month, he is  under a lot of stress, his wife is dealing with metastatic squamous cell carcinoma, he has changed to a new physician at [facility], worry about the possibility of continued ketamine infusion, which has been very helpful for him.  UPDATE Jan 27th 2016: Last injection was in October 2015, which has been helpful, today he complains of intermittent right more than left upper extremity muscle spasm, right hand finger spasm, tenderness,  bilateral neck, shoulder He also takes care of his wife, who has suffered metastatic cancer,  UPDATE Feb 22 2015: He is with his wife at today's clinical visit, last EMG guided injection was in September 2016, heel has suffered GI symptoms over the past few weeks, intractable nausea, with mild improvement now, his wife has finished her treatment, is now back to work at [employer]  UPDATE May 11th 2017: He saw his pain management physician at [name],will be admitted to [facility] for inpatient pain control, he had multiple recurrent severe back muscle spasm, is now taking narcotics, baclofen, Botox continued to help his pain, he received 600 units of Botox injection in January 2017, no significant side effect noticed.  UPDATE August 17th 2017:  I reviewed [facility] visit in Jun 15 2015,he received to right upper extremity interscalene nerve block under ultrasound guidance, which fail to improve his symptoms, actually made his symptoms worse, he was not able to accomplish physical therapy as previously planned.  It is hard to tell result from previous Botox injection in Jun 10 2015  UPDATE Jan 12 2016: He responded very well to previous injection in August 2017, over the past few months, he had frequent kidney stone.  REVIEW OF SYSTEMS: Full 14 system review of systems performed and notable only for as above  ALLERGIES: No Known Allergies  HOME MEDICATIONS: Current Outpatient Prescriptions  Medication Sig Dispense Refill  . baclofen (LIORESAL) 20 MG tablet Take 20 mg by mouth 2 (two) times daily.    . clonazePAM (KLONOPIN) 1 MG tablet Take 1 tablet (1 mg total) by mouth 2 (two) times daily. 180 tablet 1  . OnabotulinumtoxinA (BOTOX IJ) Inject 600 Units as directed.     . ondansetron (ZOFRAN) 4 MG tablet Take 1 tablet (4 mg total) by mouth every 8 (eight) hours as needed for nausea or vomiting. 60 tablet 3  . OxyCODONE HCl (ROXICODONE PO) Take by mouth as directed.    . pregabalin  (LYRICA) 150 MG capsule Take 150 mg by mouth 3 (three) times daily.    . TESTOSTERONE IM Inject 400 Units into the muscle every 30 (thirty) days.    Marland Kitchen tiZANidine (ZANAFLEX) 2 MG tablet Take 2 tablets (4 mg total) by mouth every 6 (six) hours as needed for muscle spasms. 720 tablet 1  . XARELTO 20 MG TABS tablet      No current facility-administered medications for this visit.     PAST MEDICAL HISTORY: Past Medical History:  Diagnosis Date  . Complex regional pain syndrome 04/23/2012  . Dystonia   . Dystonia 04/23/2012  . Falls   . Insomnia w/ sleep apnea   . Other fragments of torsion dystonia     PAST SURGICAL HISTORY: Past Surgical History:  Procedure Laterality Date  . KNEE SURGERY    . SHOULDER SURGERY      FAMILY HISTORY: Family History  Problem Relation Age of Onset  . Heart disease Mother   . Leukemia Father     SOCIAL HISTORY:  Social History   Social History  .  Marital status: Married    Spouse name: N/A  . Number of children: 1  . Years of education: N/A   Occupational History  . Not on file.   Social History Main Topics  . Smoking status: Former Games developer  . Smokeless tobacco: Never Used     Comment: quit 18 years ago  . Alcohol use No  . Drug use: No  . Sexual activity: Not on file   Other Topics Concern  . Not on file   Social History Narrative   Lives ath home w/ his wife     PHYSICAL EXAM   Vitals:   01/12/16 1130  BP: 136/77  Pulse: 80  Weight: 290 lb (131.5 kg)  Height: 5\' 8"  (1.727 m)    Not recorded      Body mass index is 44.09 kg/m.    Physical Exam  General: Pleasant seated in no acute distress Respiratory: clear to ausculuation bilaterally Cardiovascular: regular rate, rhythm Musculoskeletal: Right upper extremity with dystonia  Neurologic Exam  Cranial Nerves: pupils were equal reactive to light. Visual fields full. EOM's intact. He has no facial muscleweakness. Hearing normal. Tongue and palate move  normally Motor: He has neck rotated to right, right tilt, left shoulder elevation, right shoulder in anterior rotation, right elbow flexion and right hand MPI flexion, PPI and DPI extended position Sensory:  allodynia upon palpitation of neck, shoulder, upper back. Coordination: no dysmetria Gait and Station: tends to lean towards his right side, mild dsytonic posture of his right leg.  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 EMG guided BOTOX A injection,  Use 600 units of Botox A   Right levator scapulae 25 units Right upper trapezius 25 times 2= 50 units Right rhomboid 25x2=50 units   Right splenius cervicus   25 x  3=75 Right splenius capitus  25 x2=50 units Right semispinalis 25 units Right iliocostalis 25 units    Left levator scapulae 25 units Left upper trapezius 25 times 2= 50 units Left rhomboid 25x2=50 units   Left splenius cervicus   25 x  3=75 Left splenius capitus  25 x2=50 units Left semispinalis 25 units Left iliocostalis 25 units  He will come back in  3 months for repeat injections.    Levert Feinstein, M.D. Ph.D.  [facility] [name] [place], [place] [address] Ph: [phone] Fax: [phone]  CC: Referring Provider

## 2016-01-12 NOTE — Telephone Encounter (Signed)
PT NEEDS 3 MONTH BOTOX APPT SCHEDULED.

## 2016-01-12 NOTE — Progress Notes (Signed)
**  Botox 100 units x 6 vials, Lot [id number], Exp 07/2018, NDC [id number], specialty pharmacy.//mck,rn**

## 2016-01-20 NOTE — Telephone Encounter (Signed)
Called and scheduled

## 2016-02-08 ENCOUNTER — Ambulatory Visit: Payer: Self-pay | Admitting: Neurology

## 2016-04-12 ENCOUNTER — Encounter: Payer: Self-pay | Admitting: Neurology

## 2016-04-12 ENCOUNTER — Ambulatory Visit (INDEPENDENT_AMBULATORY_CARE_PROVIDER_SITE_OTHER): Payer: Managed Care, Other (non HMO) | Admitting: Neurology

## 2016-04-12 VITALS — BP 139/73 | HR 67 | Ht 68.0 in | Wt 292.0 lb

## 2016-04-12 DIAGNOSIS — G243 Spasmodic torticollis: Secondary | ICD-10-CM

## 2016-04-12 DIAGNOSIS — G5640 Causalgia of unspecified upper limb: Secondary | ICD-10-CM

## 2016-04-12 MED ORDER — CLONAZEPAM 1 MG PO TABS: 1.0000 mg | ORAL_TABLET | Freq: Two times a day (BID) | ORAL | 3 refills | Status: DC

## 2016-04-12 NOTE — Progress Notes (Signed)
PATIENT: Brandon Lin DOB: 07/12/1970  Chief Complaint  Patient presents with  . CRPS/Cervical Dystonia    Botox 600 units - specialty pharmacy     HISTORICAL  Brandon Lin, is a 46 year-old right-handed Caucasian male, has suffered complex regional pain syndrome following a right shoulder injury in 1998, he is here every 3 months for EMG guided Botox injection for his neck, shoulder pain, right upper extremity spasticity.  He fell off a deck and landed on his right neck and shoulder area in 1998, he had recurrent right shoulder pain, later he had multiple surgeries, each procedure making his symptoms worse, he fell off landed on his right shoulder again in 2005, he has developed intractable pain in his right shoulder and neck, dystonic postural, eventually was diagnosed with complex regional pain syndrome. From 2005 to 2007, he suffered severe neck pain, bilateral shoulder pain, abnormal posture of his neck, right upper extremity,to the point of difficult walking, fell many times.  He has been under the care of Dr. Jodi Mourning since 2006, receiving Botox injections every 90 days, which does help his right neck and shoulder pain.  Also under pain management at [facility] anesthesiologist Dr. Genelle Bal, he had significant pain around early 2013, he began to receive inpatient ketamine infusion since March 2013, every 3 months, initially spreading over 10 days with stable daily osage, slowly infusion 4 hours each day, but in August 2014, the infusion was cut back to 5 days, he most recent infusion in January 2015, he only received 3 days infusion.  He is on polypharmacy treatment,  He is taking baclofen.,Neurontin, Ativan 1 mg 3 times a day,  clonazepam, tizanidine, OxyCodone,  dosage is adjusted following his most recent hospital admission for pain management at [facility] in December 2014, currently 30 plus 10 mg every 3 hours, He is also receiving IV ketamine treatment every 6  months at [facility]  His father died of leukemia at age 35, mother  is healthy. He is disabled due to his condition. Married, has a son. 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    BOTOX has been very helpful, but he continues to have right neck, left neck and right  shoulder spastic pain, wearing off in 2 months.   He continues to get ketamine infusion from [facility],   There was some changes of his ketamine schedule, he got  3 days iv ketamine in Dec 2014 instead of previously scheduled 10 days, shortly after the injection, around January 2015, he developed frequent painful body muscle jerking movement, we have telephone adjusted his medications, is now taking regular dose of clonazepam 1 mg 3 times a day, Ativan 1 mg every night, baclofen 20 medical twice a day, Depakote 500 mg every night, gabapentin 600 mg twice a day, oxycodone 30-10 mg every 3 hours, tizanidine 2 mg 2 tablets every 6 hours, his painful muscle spasm has much improved, he scheduled to have repeat IV ketamine infusion in June first for 10 days at [facility] again, his right upper extremity spasticity and pain has much improved with previous injection, most of pain is at  bilateral occipital, cervical, shoulder revision now  UPDATE Oct  2015: Last Botox injection in July 2015 has been helpful, but he has developed left neck weakness, lasting for one month, he is  under a lot of stress, his wife is dealing with metastatic squamous cell carcinoma, he has changed to a new physician at [facility], worry about the possibility of continued ketamine infusion, which has been very helpful for him.  UPDATE Jan 27th 2016: Last injection was in October 2015, which has been helpful, today he complains of intermittent right more than left upper extremity muscle spasm, right hand finger spasm, tenderness,  bilateral neck, shoulder He also takes care of his wife, who has suffered metastatic cancer,  UPDATE Feb 22 2015: He is with his wife at today's clinical visit, last EMG guided injection was in September 2016, heel has suffered GI symptoms over the past few weeks, intractable nausea, with mild improvement now, his wife has finished her treatment, is now back to work at [employer]  UPDATE May 11th 2017: He saw his pain management physician at [name],will be admitted to [facility] for inpatient pain control, he had multiple recurrent severe back muscle spasm, is now taking narcotics, baclofen, Botox continued to help his pain, he received 600 units of Botox injection in January 2017, no significant side effect noticed.  UPDATE August 17th 2017:  I reviewed [facility] visit in Jun 15 2015,he received to right upper extremity interscalene nerve block under ultrasound guidance, which fail to improve his symptoms, actually made his symptoms worse, he was not able to accomplish physical therapy as previously planned.  It is hard to tell result from previous Botox injection in Jun 10 2015  UPDATE Jan 12 2016: He responded very well to previous injection in August 2017, over the past few months, he had frequent kidney stone.  UPDATE April 13 2015: He responded very well to previous Botox injection in December 2017,  REVIEW OF SYSTEMS: Full 14 system review of systems performed and notable only for as above  ALLERGIES: No Known Allergies  HOME MEDICATIONS: Current Outpatient Prescriptions  Medication Sig Dispense Refill  . baclofen (LIORESAL) 20 MG tablet Take 20 mg by mouth 2 (two) times daily.    . clonazePAM (KLONOPIN) 1 MG tablet Take 1 tablet (1 mg total) by mouth 2 (two) times daily. 180 tablet 1  . OnabotulinumtoxinA (BOTOX IJ) Inject 600 Units as directed.     . ondansetron (ZOFRAN) 4 MG tablet Take 1 tablet (4 mg total) by mouth every 8 (eight) hours as needed for nausea or  vomiting. 60 tablet 3  . OxyCODONE HCl (ROXICODONE PO) Take by mouth as directed.    . pregabalin (LYRICA) 150 MG capsule Take 150 mg by mouth 3 (three) times daily.    . TESTOSTERONE IM Inject 400 Units into the muscle every 30 (thirty) days.    Marland Kitchen tiZANidine (ZANAFLEX) 2 MG tablet Take 2 tablets (4 mg total) by mouth every 6 (six) hours as needed for muscle spasms. 720 tablet 1  . XARELTO 20 MG TABS tablet      No current facility-administered medications for this visit.     PAST MEDICAL HISTORY: Past Medical History:  Diagnosis Date  . Complex regional pain syndrome 04/23/2012  . Dystonia   . Dystonia 04/23/2012  . Falls   . Insomnia w/ sleep apnea   . Other fragments of torsion dystonia     PAST SURGICAL HISTORY: Past Surgical History:  Procedure Laterality Date  . KNEE SURGERY    . SHOULDER SURGERY      FAMILY HISTORY: Family History  Problem Relation Age of Onset  . Heart disease Mother   .  Leukemia Father     SOCIAL HISTORY:  Social History   Social History  . Marital status: Married    Spouse name: N/A  . Number of children: 1  . Years of education: N/A   Occupational History  . Not on file.   Social History Main Topics  . Smoking status: Former Games developer  . Smokeless tobacco: Never Used     Comment: quit 18 years ago  . Alcohol use No  . Drug use: No  . Sexual activity: Not on file   Other Topics Concern  . Not on file   Social History Narrative   Lives ath home w/ his wife     PHYSICAL EXAM   Vitals:   04/12/16 1420  BP: 139/73  Pulse: 67  Weight: 292 lb (132.5 kg)  Height: 5\' 8"  (1.727 m)    Not recorded      Body mass index is 44.4 kg/m.    Physical Exam  General: Pleasant seated in no acute distress Respiratory: clear to ausculuation bilaterally Cardiovascular: regular rate, rhythm Musculoskeletal: Right upper extremity with dystonia  Neurologic Exam  Cranial Nerves: pupils were equal reactive to light. Visual fields full.  EOM's intact. He has no facial muscleweakness. Hearing normal. Tongue and palate move normally Motor: He has neck rotated to right, right tilt, left shoulder elevation, right shoulder in anterior rotation, right elbow flexion and right hand MPI flexion, PPI and DPI extended position Sensory:  allodynia upon palpitation of neck, shoulder, upper back. Coordination: no dysmetria Gait and Station: tends to lean towards his right side, mild dsytonic posture of his right leg.  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 EMG guided BOTOX A injection,  Use 600 units of Botox A   Right levator scapulae 25 units Right upper trapezius 25 times 2= 50 units Right rhomboid 25x2=50 units   Right splenius cervicus   25 x  3=75 Right splenius capitus  25 x2=50 units Right semispinalis 25 units Right iliocostalis 25 units    Left levator scapulae 25 units Left upper trapezius 25 times 2= 50 units Left rhomboid 25x2=50 units   Left splenius cervicus   25 x  3=75 Left splenius capitus  25 x2=50 units Left semispinalis 25 units Left iliocostalis 25 units  He will come back in  3 months for repeat injections.    Levert Feinstein, M.D. Ph.D.  [facility] [address], [address] [place], [place] [address] Ph: [phone] Fax: [phone]  CC: Referring Provider

## 2016-04-12 NOTE — Progress Notes (Signed)
**  Botox 100 units x 6 vials, NDC [id number], Lot [id number], Exp 10/2018, specialty pharmacy.//mck,rn**

## 2016-08-08 ENCOUNTER — Telehealth: Payer: Self-pay | Admitting: Neurology

## 2016-08-08 NOTE — Telephone Encounter (Signed)
Patient called office in reference to scheduling botox.  Please call

## 2016-08-30 ENCOUNTER — Encounter: Payer: Self-pay | Admitting: Neurology

## 2016-08-30 ENCOUNTER — Telehealth: Payer: Self-pay | Admitting: Neurology

## 2016-08-30 ENCOUNTER — Ambulatory Visit (INDEPENDENT_AMBULATORY_CARE_PROVIDER_SITE_OTHER): Payer: Managed Care, Other (non HMO) | Admitting: Neurology

## 2016-08-30 VITALS — BP 147/92 | HR 78 | Ht 68.0 in | Wt 294.0 lb

## 2016-08-30 DIAGNOSIS — G5640 Causalgia of unspecified upper limb: Secondary | ICD-10-CM

## 2016-08-30 DIAGNOSIS — G90519 Complex regional pain syndrome I of unspecified upper limb: Secondary | ICD-10-CM | POA: Diagnosis not present

## 2016-08-30 DIAGNOSIS — G243 Spasmodic torticollis: Secondary | ICD-10-CM

## 2016-08-30 DIAGNOSIS — G249 Dystonia, unspecified: Secondary | ICD-10-CM | POA: Diagnosis not present

## 2016-08-30 NOTE — Telephone Encounter (Signed)
Pt needs botox in 3 mos °

## 2016-08-30 NOTE — Progress Notes (Signed)
**  Botox 100 units x 6 vials, NDC [id number], Lot [id number], Exp 03/2019, specialty pharmacy.//mck,rn**

## 2016-08-30 NOTE — Progress Notes (Signed)
PATIENT: Brandon Lin DOB: [birth year]  Chief Complaint  Patient presents with  . CRPS/Cervical Dystonia    Botox 100 units x 6 vials - specialty pharmacy     HISTORICAL  Brandon Lin, is a 46 year-old right-handed Caucasian male, has suffered complex regional pain syndrome following a right shoulder injury in 1998, he is here every 3 months for EMG guided Botox injection for his neck, shoulder pain, right upper extremity spasticity.  He fell off a deck and landed on his right neck and shoulder area in 1998, he had recurrent right shoulder pain, later he had multiple surgeries, each procedure making his symptoms worse, he fell off landed on his right shoulder again in 2005, he has developed intractable pain in his right shoulder and neck, dystonic postural, eventually was diagnosed with complex regional pain syndrome. From 2005 to 2007, he suffered severe neck pain, bilateral shoulder pain, abnormal posture of his neck, right upper extremity,to the point of difficult walking, fell many times.  He has been under the care of Dr. Jodi Mourning since 2006, receiving Botox injections every 90 days, which does help his right neck and shoulder pain.  Also under pain management at [facility] anesthesiologist Dr. Genelle Bal, he had significant pain around early 2013, he began to receive inpatient ketamine infusion since March 2013, every 3 months, initially spreading over 10 days with stable daily osage, slowly infusion 4 hours each day, but in August 2014, the infusion was cut back to 5 days, he most recent infusion in January 2015, he only received 3 days infusion.  He is on polypharmacy treatment,  He is taking baclofen.,Neurontin, Ativan 1 mg 3 times a day,  clonazepam, tizanidine, OxyCodone,  dosage is adjusted following his most recent hospital admission for pain management at [facility] in December 2014, currently 30 plus 10 mg every 3 hours, He is also receiving IV ketamine treatment  every 6 months at [facility]  His father died of leukemia at age 37, mother  is healthy. He is disabled due to his condition. Married, has a son. 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    BOTOX has been very helpful, but he continues to have right neck, left neck and right  shoulder spastic pain, wearing off in 2 months.   He continues to get ketamine infusion from [facility],   There was some changes of his ketamine schedule, he got  3 days iv ketamine in Dec 2014 instead of previously scheduled 10 days, shortly after the injection, around January 2015, he developed frequent painful body muscle jerking movement, we have telephone adjusted his medications, is now taking regular dose of clonazepam 1 mg 3 times a day, Ativan 1 mg every night, baclofen 20 medical twice a day, Depakote 500 mg every night, gabapentin 600 mg twice a day, oxycodone 30-10 mg every 3 hours, tizanidine 2 mg 2 tablets every 6 hours, his painful muscle spasm has much improved, he scheduled to have repeat IV ketamine infusion in June first for 10 days at [facility] again, his right upper extremity spasticity and pain has much improved with previous injection, most of pain is at  bilateral occipital, cervical, shoulder revision now  UPDATE Oct  2015: Last Botox injection in July 2015 has been helpful, but he has developed left neck weakness, lasting for one  month, he is under a lot of stress, his wife is dealing with metastatic squamous cell carcinoma, he has changed to a new physician at [facility], worry about the possibility of continued ketamine infusion, which has been very helpful for him.  UPDATE Jan 27th 2016: Last injection was in October 2015, which has been helpful, today he complains of intermittent right more than left upper extremity muscle spasm, right hand finger spasm, tenderness,  bilateral neck, shoulder He also takes care of his wife, who has suffered metastatic cancer,  UPDATE Feb 22 2015: He is with his wife at today's clinical visit, last EMG guided injection was in September 2016, heel has suffered GI symptoms over the past few weeks, intractable nausea, with mild improvement now, his wife has finished her treatment, is now back to work at [employer]  UPDATE May 11th 2017: He saw his pain management physician at [name],will be admitted to [facility] for inpatient pain control, he had multiple recurrent severe back muscle spasm, is now taking narcotics, baclofen, Botox continued to help his pain, he received 600 units of Botox injection in January 2017, no significant side effect noticed.  UPDATE August 17th 2017:  I reviewed [facility] visit in Jun 15 2015,he received to right upper extremity interscalene nerve block under ultrasound guidance, which fail to improve his symptoms, actually made his symptoms worse, he was not able to accomplish physical therapy as previously planned.  It is hard to tell result from previous Botox injection in Jun 10 2015  UPDATE Jan 12 2016: He responded very well to previous injection in August 2017, over the past few months, he had frequent kidney stone.  UPDATE April 13 2015: He responded very well to previous Botox injection in December 2017,  UPDATE August 30 2016: He responded very well to previous injection on April 13 2015, in recent few weeks he noticed increased right hand finger cramping, deep achy pain,  REVIEW OF SYSTEMS: Full 14 system review of systems performed and notable only for as above  ALLERGIES: No Known Allergies  HOME MEDICATIONS: Current Outpatient Prescriptions  Medication Sig Dispense Refill  . baclofen (LIORESAL) 20 MG tablet Take 20 mg by mouth 2 (two) times daily.    . clonazePAM (KLONOPIN) 1 MG tablet Take 1 tablet (1 mg total) by mouth 2 (two) times daily. 180 tablet 3  .  OnabotulinumtoxinA (BOTOX IJ) Inject 600 Units as directed.     . ondansetron (ZOFRAN) 4 MG tablet Take 1 tablet (4 mg total) by mouth every 8 (eight) hours as needed for nausea or vomiting. 60 tablet 3  . OxyCODONE HCl (ROXICODONE PO) Take by mouth as directed.    . pregabalin (LYRICA) 150 MG capsule Take 150 mg by mouth 3 (three) times daily.    . TESTOSTERONE IM Inject 400 Units into the muscle every 30 (thirty) days.    Marland Kitchen tiZANidine (ZANAFLEX) 2 MG tablet Take 2 tablets (4 mg total) by mouth every 6 (six) hours as needed for muscle spasms. 720 tablet 1  . XARELTO 20 MG TABS tablet      No current facility-administered medications for this visit.     PAST MEDICAL HISTORY: Past Medical History:  Diagnosis Date  . Complex regional pain syndrome 04/23/2012  . Dystonia   . Dystonia 04/23/2012  . Falls   . Insomnia w/ sleep apnea   . Other fragments of torsion dystonia     PAST SURGICAL HISTORY: Past Surgical History:  Procedure Laterality  Date  . KNEE SURGERY    . SHOULDER SURGERY      FAMILY HISTORY: Family History  Problem Relation Age of Onset  . Heart disease Mother   . Leukemia Father     SOCIAL HISTORY:  Social History   Social History  . Marital status: Married    Spouse name: N/A  . Number of children: 1  . Years of education: N/A   Occupational History  . Not on file.   Social History Main Topics  . Smoking status: Former Games developer  . Smokeless tobacco: Never Used     Comment: quit 18 years ago  . Alcohol use No  . Drug use: No  . Sexual activity: Not on file   Other Topics Concern  . Not on file   Social History Narrative   Lives ath home w/ his wife     PHYSICAL EXAM   Vitals:   08/30/16 1455  BP: (!) 147/92  Pulse: 78  Weight: 294 lb (133.4 kg)  Height: 5\' 8"  (1.727 m)    Not recorded      Body mass index is 44.7 kg/m.    Physical Exam  General: Pleasant seated in no acute distress Respiratory: clear to ausculuation  bilaterally Cardiovascular: regular rate, rhythm Musculoskeletal: Right upper extremity with dystonia  Neurologic Exam  Cranial Nerves: pupils were equal reactive to light. Visual fields full. EOM's intact. He has no facial muscleweakness. Hearing normal. Tongue and palate move normally Motor: He has neck rotated to right, right tilt, left shoulder elevation, right shoulder in anterior rotation, right elbow flexion and right hand MPI flexion, PPI and DPI extended position Sensory:  allodynia upon palpitation of neck, shoulder, upper back. Coordination: no dysmetria Gait and Station: tends to lean towards his right side, mild dsytonic posture of his right leg.  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 EMG guided BOTOX A injection,  Used 600 units of Botox A   Right levator scapulae 25 units Right upper trapezius 25 times 2= 50 units Right rhomboid 25x2=50 units   Right splenius cervicus   25 x  3=75 Right splenius capitus  25 x2=50 units Right semispinalis 25 units Right iliocostalis 25 units Right levator scapular 25 units   Left levator scapulae 25 units Left upper trapezius 25 times 2= 50 units Left rhomboid 25x2=50 units Left splenius capitus  25 x2=50 units   Right palmaris longus 25 units Right pronator teres 25 units Right lumbricals 50 units divided into 3 injection sites.  He will come back in  3 months for repeat injections.    Levert Feinstein, M.D. Ph.D.  [facility] [address] [place], [place] [address] Ph: [phone] Fax: [phone]  CC: Referring Provider

## 2016-09-01 NOTE — Telephone Encounter (Signed)
I called and scheduled the patient.  °

## 2016-11-22 ENCOUNTER — Other Ambulatory Visit: Payer: Self-pay | Admitting: *Deleted

## 2016-11-22 ENCOUNTER — Other Ambulatory Visit: Payer: Self-pay | Admitting: Neurology

## 2016-11-22 MED ORDER — CLONAZEPAM 1 MG PO TABS: 1.0000 mg | ORAL_TABLET | Freq: Two times a day (BID) | ORAL | 1 refills | Status: DC

## 2016-12-06 ENCOUNTER — Ambulatory Visit: Payer: Managed Care, Other (non HMO) | Admitting: Neurology

## 2016-12-06 ENCOUNTER — Telehealth: Payer: Self-pay | Admitting: Neurology

## 2016-12-06 NOTE — Telephone Encounter (Signed)
Pt wife called to inform that pt will not be able to make appointment because he is passing a kidney stone, she is asking to be called to r/s pt

## 2016-12-07 ENCOUNTER — Encounter: Payer: Self-pay | Admitting: Neurology

## 2016-12-07 NOTE — Telephone Encounter (Signed)
I called and spoke with the patient, we rescheduled his injection for December.

## 2017-01-04 ENCOUNTER — Encounter: Payer: Self-pay | Admitting: Neurology

## 2017-01-04 ENCOUNTER — Other Ambulatory Visit: Payer: Self-pay

## 2017-01-04 ENCOUNTER — Ambulatory Visit (INDEPENDENT_AMBULATORY_CARE_PROVIDER_SITE_OTHER): Payer: Managed Care, Other (non HMO) | Admitting: Neurology

## 2017-01-04 ENCOUNTER — Telehealth: Payer: Self-pay | Admitting: Neurology

## 2017-01-04 VITALS — BP 154/98 | HR 107 | Resp 20 | Wt 293.0 lb

## 2017-01-04 DIAGNOSIS — G243 Spasmodic torticollis: Secondary | ICD-10-CM

## 2017-01-04 DIAGNOSIS — G5640 Causalgia of unspecified upper limb: Secondary | ICD-10-CM

## 2017-01-04 NOTE — Progress Notes (Signed)
PATIENT: Brandon Lin DOB: 1970/07/29  Chief Complaint  Patient presents with  . CRPS    Here for Botox inj.  Sts. inj. left side of neck wore off early this time.  He would like to discuss increasing amt. of Botox there.  Not sure he wants inj. in right hand/fingers again--shots were painful for 3 days/fim     HISTORICAL  Brandon Lin, is a 46 year-old right-handed Caucasian male, has suffered complex regional pain syndrome following a right shoulder injury in 1998, he is here every 3 months for EMG guided Botox injection for his neck, shoulder pain, right upper extremity spasticity.  He fell off a deck and landed on his right neck and shoulder area in 1998, he had recurrent right shoulder pain, later he had multiple surgeries, each procedure making his symptoms worse, he fell off landed on his right shoulder again in 2005, he has developed intractable pain in his right shoulder and neck, dystonic postural, eventually was diagnosed with complex regional pain syndrome. From 2005 to 2007, he suffered severe neck pain, bilateral shoulder pain, abnormal posture of his neck, right upper extremity,to the point of difficult walking, fell many times.  He has been under the care of Dr. Jodi Mourningeynold since 2006, receiving Botox injections every 90 days, which does help his right neck and shoulder pain.  [name] anesthesiologist Dr. Genelle BalSteven Blau, he had significant pain around early 2013, he began to receive inpatient ketamine infusion since March 2013, every 3 months, initially spreading over 10 days with stable daily osage, slowly infusion 4 hours each day, but in August 2014, the infusion was cut back to 5 days, he most recent infusion in January 2015, he only received 3 days infusion.  He is on polypharmacy treatment,  He is taking baclofen.,Neurontin, Ativan 1 mg 3 times a day,  clonazepam, tizanidine, OxyCodone,  dosage is adjusted following his most recent  hospital [name] in [facility] 2014, currently 30 plus 10 mg every 3 hours, He is also receiving IV ket[name]  His father died of leukemia at age 47, mother  is healthy. He is disabled due to his condition. Married, has a son. 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    BOTOX has been very helpful, but he continues to have right neck, left neck and right  shoulder spastic pain, wearing off in 2 months.   He [facility] to [facility],   There was some changes of his ketamine schedule, he got  3 days iv ketamine in Dec 2014 instead of previously scheduled 10 days, shortly after the injection, around January 2015, he developed frequent painful body muscle jerking movement, we have telephone adjusted his medications, is now taking regular dose of clonazepam 1 mg 3 times a day, Ativan 1 mg every night, baclofen 20 medical twice a day, Depakote 500 mg every night, gabapentin 600 mg twice a day, oxycodone 30-10 mg every 3 hours, tizanidine 2 mg 2 tablets every 6 hours, his painful muscle spasm has much improved, he scheduled to have repeat IV ketamine infusion in June first for 10 [name] again, his right upper extremity spasticity and pain has much improved with previous injection, most of pain is  at  bilateral occipital, cervical, shoulder revision now  UPDATE Oct  2015: Last Botox injection in July 2015 has been helpful, but he has developed left neck weakness, lasting for one month, he is under a lot of stress, his wife is dealing with metastatic squamous cell carcinoma, he has changed to a [name], worry about the possibility of continued ketamine infusion, which has been very helpful for him.  UPDATE Jan 27th 2016: Last injection was in October  2015, which has been helpful, today he complains of intermittent right more than left upper extremity muscle spasm, right hand finger spasm, tenderness, bilateral neck, shoulder He also takes care of his wife, who has suffered metastatic cancer,  UPDATE Feb 22 2015: He is with his wife at today's clinical visit, last EMG guided injection was in September 2016, heel has suffered GI symptoms over the past few weeks, intractable nausea, with mild improvement now, his wife has finished her treatment, is now back to work at [employer]  UPDATE May 11th 2017: He saw his pain management physician at [name],[name], he had multiple recurrent severe back muscle spasm, is now taking narcotics, baclofen, Botox continued to help his pain, he received 600 units of Botox injection in January 2017, no significant side effect noticed.  UPDATE August 17th 2017:  I reviewed [name] in Jun [facility] 2015,he received to right upper extremity interscalene nerve block under ultrasound guidance, which fail to improve his symptoms, actually made his symptoms worse, he was not able to accomplish physical therapy as previously planned.  It is hard to tell result from previous Botox injection in Jun 10 2015  UPDATE Jan 12 2016: He responded very well to previous injection in August 2017, over the past few months, he had frequent kidney stone.  UPDATE April 13 2015: He responded very well to previous Botox injection in December 2017,  UPDATE August 30 2016: He responded very well to previous injection on April 13 2015, in recent few weeks he noticed increased right hand finger cramping, deep achy pain,  Update January 04 2017: He responded well to previous injection, suffered kidney stone, no complaints of worsening left shoulder blade pain,  REVIEW OF SYSTEMS: Full 14 system review of systems performed and notable only for as above  ALLERGIES: No Known  Allergies  HOME MEDICATIONS: Current Outpatient Medications  Medication Sig Dispense Refill  . baclofen (LIORESAL) 20 MG tablet Take 20 mg by mouth 2 (two) times daily.    . clonazePAM (KLONOPIN) 1 MG tablet Take 1 tablet (1 mg total) by mouth 2 (two) times daily. 180 tablet 1  . OnabotulinumtoxinA (BOTOX IJ) Inject 600 Units as directed.     . ondansetron (ZOFRAN) 4 MG tablet TAKE 1 TABLET BY MOUTH  EVERY 8 HOURS AS NEEDED FOR NAUSEA OR VOMITING 60 tablet 3  . OxyCODONE HCl (ROXICODONE PO) Take by mouth as directed.    . pregabalin (LYRICA) 150 MG capsule Take 150 mg by mouth 3 (three) times daily.    . TESTOSTERONE IM Inject 400 Units into the muscle every 30 (thirty) days.    Marland Kitchen. tiZANidine (ZANAFLEX) 2 MG tablet Take 2 tablets (4 mg total) by mouth every 6 (six) hours as needed for muscle spasms. 720 tablet 1  . XARELTO 20 MG TABS tablet      No current facility-administered medications for this visit.     PAST MEDICAL HISTORY: Past  Medical History:  Diagnosis Date  . Complex regional pain syndrome 04/23/2012  . Dystonia   . Dystonia 04/23/2012  . Falls   . Insomnia w/ sleep apnea   . Other fragments of torsion dystonia     PAST SURGICAL HISTORY: Past Surgical History:  Procedure Laterality Date  . KNEE SURGERY    . SHOULDER SURGERY      FAMILY HISTORY: Family History  Problem Relation Age of Onset  . Heart disease Mother   . Leukemia Father     SOCIAL HISTORY:  Social History   Socioeconomic History  . Marital status: Married    Spouse name: Not on file  . Number of children: 1  . Years of education: Not on file  . Highest education level: Not on file  Social Needs  . Financial resource strain: Not on file  . Food insecurity - worry: Not on file  . Food insecurity - inability: Not on file  . Transportation needs - medical: Not on file  . Transportation needs - non-medical: Not on file  Occupational History  . Not on file  Tobacco Use  . Smoking status:  Former Games developer  . Smokeless tobacco: Never Used  . Tobacco comment: quit 18 years ago  Substance and Sexual Activity  . Alcohol use: No  . Drug use: No  . Sexual activity: Not on file  Other Topics Concern  . Not on file  Social History Narrative   Lives ath home w/ his wife     PHYSICAL EXAM   Vitals:   01/04/17 1518  BP: (!) 154/98  Pulse: (!) 107  Resp: 20  Weight: 293 lb (132.9 kg)    Not recorded      Body mass index is 44.55 kg/m.    Physical Exam  General: Pleasant seated in no acute distress Respiratory: clear to ausculuation bilaterally Cardiovascular: regular rate, rhythm Musculoskeletal: Right upper extremity with dystonia  Neurologic Exam  Cranial Nerves: pupils were equal reactive to light. Visual fields full. EOM's intact. He has no facial muscleweakness. Hearing normal. Tongue and palate move normally Motor: He has neck rotated to right, right tilt, left shoulder elevation, right shoulder in anterior rotation, right elbow flexion and right hand MPI flexion, PPI and DPI extended position Sensory:  allodynia upon palpitation of neck, shoulder, upper back. Coordination: no dysmetria Gait and Station: tends to lean towards his right side, mild dsytonic posture of his right leg.  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 EMG guided BOTOX A injection,  Used 600 units of Botox A   Right levator scapulae 25 units Right upper trapezius 25 times 2= 50 units Right rhomboid 25x2=50 units   Right splenius cervicus   25 x  3=75 Right splenius capitus  25 x2=50 units Right semispinalis 25 units Right iliocostalis 25 units Right levator scapular 25 units   Left levator scapulae 25 units Left upper trapezius 25 times 2= 50 units Left rhomboid 25x2=50 units Left splenius capitus  25 x2=50 units  Right rhomboid 50 units, Left rhomboid 50 units  He will come back in  3 months for  repeat injections.    Levert Feinstein, M.D. Ph.D.  [facility] [address] [place], [place] [address] Ph: [phone] Fax: [phone]  CC: Referring Provider

## 2017-01-04 NOTE — Telephone Encounter (Signed)
Please call pt to schedule BOTOX appt. Thank you. °

## 2017-01-04 NOTE — Progress Notes (Signed)
**  Botox 100 units x 6 vials, NDC [id number], Lot [id number], Exp 05/2019, specialty pharmacy.//mck,rn**

## 2017-01-12 NOTE — Telephone Encounter (Signed)
I called and scheduled the patient.  °

## 2017-01-16 ENCOUNTER — Ambulatory Visit: Payer: Managed Care, Other (non HMO) | Admitting: Neurology

## 2017-04-10 ENCOUNTER — Ambulatory Visit (INDEPENDENT_AMBULATORY_CARE_PROVIDER_SITE_OTHER): Payer: Managed Care, Other (non HMO) | Admitting: Neurology

## 2017-04-10 ENCOUNTER — Encounter: Payer: Self-pay | Admitting: Neurology

## 2017-04-10 VITALS — BP 132/76 | HR 104 | Ht 68.0 in | Wt 293.0 lb

## 2017-04-10 DIAGNOSIS — G249 Dystonia, unspecified: Secondary | ICD-10-CM

## 2017-04-10 DIAGNOSIS — G5640 Causalgia of unspecified upper limb: Secondary | ICD-10-CM

## 2017-04-10 DIAGNOSIS — G243 Spasmodic torticollis: Secondary | ICD-10-CM | POA: Diagnosis not present

## 2017-04-10 NOTE — Progress Notes (Addendum)
PATIENT: Brandon ChildsRaymond C Humes DOB: 1970/04/30  Chief Complaint  Patient presents with  . CRPS/Cervical Dystonia    Botox 100 units x 6 vials - specialty pharmacy     HISTORICAL  Brandon Lin, is a 47 year-old right-handed Caucasian male, has suffered complex regional pain syndrome following a right shoulder injury in 1998, he is here every 3 months for EMG guided Botox injection for his neck, shoulder pain, right upper extremity spasticity.  He fell off a deck and landed on his right neck and shoulder area in 1998, he had recurrent right shoulder pain, later he had multiple surgeries, each procedure making his symptoms worse, he fell off landed on his right shoulder again in 2005, he has developed intractable pain in his right shoulder and neck, dystonic postural, eventually was diagnosed with complex regional pain syndrome. From 2005 to 2007, he suffered severe neck pain, bilateral shoulder pain, abnormal posture of his neck, right upper extremity,to the point of difficult walking, fell many times.  He has been under the care of Dr. Jodi Mourningeynold since 2006, receiving Botox injections every 90 days, which does help his right neck and shoulder pain.  [name]iologist Dr. Genelle BalSteven Blau, he had significant pain around early 2013, he began to receive inpatient ketamine infusion since March 2013, every 3 months, initially spreading over 10 days with stable daily osage, slowly infusion 4 hours each day, but in August 2014, the infusion was cut back to 5 days, he most recent infusion in January 2015, he only received 3 days infusion.  He is on polypharmacy treatment,  He is taking baclofen.,Neurontin, Ativan 1 mg 3 times a day,  clonazepam, tizanidine, OxyCodone,  dosage is adjusted following his most recent hospital admission for [name] in December 2014, currently 30 plus 10 mg every 3 hours, He is also receiving IV ketamine treatment  [name]  His father died of leukemia at age 48, mother  is healthy. He is disabled due to his condition. Married, has a son. 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    BOTOX has been very helpful, but he continues to have right neck, left neck and right  shoulder spastic pain, wearing off in 2 months.   He continues to get [facility],   There was some changes of his ketamine schedule, he got  3 days iv ketamine in Dec 2014 instead of previously scheduled 10 days, shortly after the injection, around January 2015, he developed frequent painful body muscle jerking movement, we have telephone adjusted his medications, is now taking regular dose of clonazepam 1 mg 3 times a day, Ativan 1 mg every night, baclofen 20 medical twice a day, Depakote 500 mg every night, gabapentin 600 mg twice a day, oxycodone 30-10 mg every 3 hours, tizanidine 2 mg 2 tablets every 6 hours, his painful muscle spasm has much improved, he scheduled to have repeat IV ketamine infusion in June first for 10 [name] again, his right upper extremity spasticity and pain has much improved with previous injection, most of pain is at  bilateral occipital, cervical, shoulder revision now  UPDATE Oct  2015: Last Botox injection in July 2015 has been helpful, but he has developed left neck weakness, lasting for one  month, he is under a lot of stress, his wife is dealing with metastatic squamous cell carcinoma, he has changed to a new physician [name], worry about the possibility of continued ketamine infusion, which has been very helpful for him.  UPDATE Jan 27th 2016: Last injection was in October 2015, which has been helpful, today he complains of intermittent right more than left upper extremity muscle spasm, right hand finger spasm, tenderness,  bilateral neck, shoulder He also takes care of his wife, who has suffered metastatic cancer,  UPDATE Feb 22 2015: He is with his wife at today's clinical visit, last EMG guided injection was in September 2016, heel has suffered GI symptoms over the past few weeks, intractable nausea, with mild improvement now, his wife has finished her treatment, is now back to work at [employer]  UPDATE May 11th 2017: He saw his pain management physician at [name],will be [name], he had multiple recurrent severe back muscle spasm, is now taking narcotics, baclofen, Botox continued to help his pain, he received 600 units of Botox injection in January 2017, no significant side effect noticed.  UPDATE August 17th 2017:  I reviewed [name] visit in Jun 15 2015,he received to right upper extremity interscalene nerve block under ultrasound guidance, which fail to improve his symptoms, actually made his symptoms worse, he was not able to accomplish physical therapy as previously planned.  It is hard to tell result from previous Botox injection in Jun 10 2015  UPDATE Jan 12 2016: He responded very well to previous injection in August 2017, over the past few months, he had frequent kidney stone.  UPDATE April 13 2015: He responded very well to previous Botox injection in December 2017,  UPDATE August 30 2016: He responded very well to previous injection on April 13 2015, in recent few weeks he noticed increased right hand finger cramping, deep achy pain,  Update January 04 2017: He responded well to previous injection, suffered kidney stone, no complaints of worsening left shoulder blade pain,  Update April 10, 2017: He is seeing [name], who have suggested him to taper off clonazepam  REVIEW OF SYSTEMS: Full 14 system review of systems performed and notable only for as above  ALLERGIES: No Known Allergies  HOME  MEDICATIONS: Current Outpatient Medications  Medication Sig Dispense Refill  . baclofen (LIORESAL) 20 MG tablet Take 20 mg by mouth 2 (two) times daily.    . clonazePAM (KLONOPIN) 1 MG tablet Take 1 tablet (1 mg total) by mouth 2 (two) times daily. 180 tablet 1  . OnabotulinumtoxinA (BOTOX IJ) Inject 600 Units as directed.     . ondansetron (ZOFRAN) 4 MG tablet TAKE 1 TABLET BY MOUTH  EVERY 8 HOURS AS NEEDED FOR NAUSEA OR VOMITING 60 tablet 3  . OxyCODONE HCl (ROXICODONE PO) Take by mouth as directed.    . pregabalin (LYRICA) 150 MG capsule Take 150 mg by mouth 3 (three) times daily.    . TESTOSTERONE IM Inject 400 Units into the muscle every 30 (thirty) days.    Marland Kitchen. tiZANidine (ZANAFLEX) 2 MG tablet Take 2 tablets (4 mg total) by mouth every 6 (six) hours as needed for muscle spasms. 720 tablet 1  . XARELTO 20 MG TABS tablet      No current facility-administered medications for this visit.     PAST MEDICAL HISTORY: Past Medical History:  Diagnosis Date  . Complex regional pain  syndrome 04/23/2012  . Dystonia   . Dystonia 04/23/2012  . Falls   . Insomnia w/ sleep apnea   . Other fragments of torsion dystonia     PAST SURGICAL HISTORY: Past Surgical History:  Procedure Laterality Date  . KNEE SURGERY    . SHOULDER SURGERY      FAMILY HISTORY: Family History  Problem Relation Age of Onset  . Heart disease Mother   . Leukemia Father     SOCIAL HISTORY:  Social History   Socioeconomic History  . Marital status: Married    Spouse name: Not on file  . Number of children: 1  . Years of education: Not on file  . Highest education level: Not on file  Social Needs  . Financial resource strain: Not on file  . Food insecurity - worry: Not on file  . Food insecurity - inability: Not on file  . Transportation needs - medical: Not on file  . Transportation needs - non-medical: Not on file  Occupational History  . Not on file  Tobacco Use  . Smoking status: Former Games developer  .  Smokeless tobacco: Never Used  . Tobacco comment: quit 18 years ago  Substance and Sexual Activity  . Alcohol use: No  . Drug use: No  . Sexual activity: Not on file  Other Topics Concern  . Not on file  Social History Narrative   Lives ath home w/ his wife     PHYSICAL EXAM   Vitals:   04/10/17 1520  BP: 132/76  Pulse: (!) 104  Weight: 293 lb (132.9 kg)  Height: 5\' 8"  (1.727 m)    Not recorded      Body mass index is 44.55 kg/m.    Physical Exam  General: Pleasant seated in no acute distress Respiratory: clear to ausculuation bilaterally Cardiovascular: regular rate, rhythm Musculoskeletal: Right upper extremity with dystonia  Neurologic Exam  Cranial Nerves: pupils were equal reactive to light. Visual fields full. EOM's intact. He has no facial muscleweakness. Hearing normal. Tongue and palate move normally Motor: He has neck rotated to right, right tilt, left shoulder elevation, right shoulder in anterior rotation, right elbow flexion and right hand MPI flexion, PPI and DPI extended position Sensory:  allodynia upon palpitation of neck, shoulder, upper back. Coordination: no dysmetria Gait and Station: tends to lean towards his right side, mild dsytonic posture of his right leg.  Assessment and Plan:  47 yo with complex regional pain syndrome, dystonia of right neck and right upper extremity. EMG guided BOTOX A injection,  Used 600 units of Botox A   Right levator scapulae 25 units Right upper trapezius 25 times 2= 50 units Right rhomboid 25 units Right upper thoracic paraspinal muscles 25 units   Right splenius cervicus   25 x  3=75 Right splenius capitus  25 x2=50 units Right semispinalis 25 units Right iliocostalis 25 units Right levator scapular 25 units   Left levator scapulae 25 units Left upper trapezius 25 times 2= 50 units Left rhomboid 25 units Left upper thoracic paraspinal muscles 25 units Left splenius capitus  25 x2=50 units  Right  rhomboid 50 units, Left rhomboid 50 units  He will come back in  3 months for repeat injections.    Levert Feinstein, M.D. Ph.D.  [facility] [address], [address] [place], [place] [address] Ph: [phone] Fax: [phone]  CC: Referring Provider

## 2017-04-10 NOTE — Progress Notes (Signed)
**  Botox 100 units x 6 vials, NDC 5621-3086-570023-1145-01, Lot [id number], Exp 08/2019, specialty pharmacy.//mck,rn**

## 2017-07-02 ENCOUNTER — Ambulatory Visit: Payer: Self-pay | Admitting: Neurology

## 2017-07-03 ENCOUNTER — Ambulatory Visit: Payer: Managed Care, Other (non HMO) | Admitting: Neurology

## 2017-07-03 ENCOUNTER — Encounter: Payer: Self-pay | Admitting: Neurology

## 2017-07-03 VITALS — BP 138/88 | HR 96 | Ht 68.0 in | Wt 294.5 lb

## 2017-07-03 DIAGNOSIS — G243 Spasmodic torticollis: Secondary | ICD-10-CM

## 2017-07-03 DIAGNOSIS — G249 Dystonia, unspecified: Secondary | ICD-10-CM | POA: Diagnosis not present

## 2017-07-03 DIAGNOSIS — G5640 Causalgia of unspecified upper limb: Secondary | ICD-10-CM | POA: Diagnosis not present

## 2017-07-03 NOTE — Progress Notes (Signed)
PATIENT: Brandon Lin DOB: 14-Nov-1970  Chief Complaint  Patient presents with  . CRPS/Cervical Dystonia    Botox 100 units x 5 vials - specialty pharmacy     HISTORICAL  [name], is a 47 year-old right-handed Caucasian male, has suffered complex regional pain syndrome following a right shoulder injury in 1998, he is here every 3 months for EMG guided Botox injection for his neck, shoulder pain, right upper extremity spasticity.  He fell off a deck and landed on his right neck and shoulder area in 1998, he had recurrent right shoulder pain, later he had multiple surgeries, each procedure making his symptoms worse, he fell off landed on his right shoulder again in 2005, he has developed intractable pain in his right shoulder and neck, dystonic postural, eventually was diagnosed with complex regional pain syndrome. From 2005 to 2007, he suffered severe neck pain, bilateral shoulder pain, abnormal posture of his neck, right upper extremity,to the point of difficult walking, fell many times.  He has been under the care of Dr. Jodi Mourning since 2006, receiving Botox injections every 90 days, which does help his right neck and shoulder pain.  Also under pain management at [facility] anesthesiologist Dr. Genelle Bal, he had significant pain around early 2013, he began to receive inpatient ketamine infusion since March 2013, every 3 months, initially spreading over 10 days with stable daily osage, slowly infusion 4 hours each day, but in August 2014, the infusion was cut back to 5 days, he most recent infusion in January 2015, he only received 3 days infusion.  He is on polypharmacy treatment,  He is taking baclofen.,Neurontin, Ativan 1 mg 3 times a day,  clonazepam, tizanidine, OxyCodone,  dosage is adjusted following his most recent hospital admission for pain management at [facility] in December 2014, currently 30 plus 10 mg every 3 hours, He is also receiving IV ketamine treatment  every 6 months at [facility]  His father died of leukemia at age 53, mother  is healthy. He is disabled due to his condition. Married, has a son. 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    BOTOX has been very helpful, but he continues to have right neck, left neck and right  shoulder spastic pain, wearing off in 2 months.   He continues to get ketamine infusion from [facility],   There was some changes of his ketamine schedule, he got  3 days iv ketamine in Dec 2014 instead of previously scheduled 10 days, shortly after the injection, around January 2015, he developed frequent painful body muscle jerking movement, we have telephone adjusted his medications, is now taking regular dose of clonazepam 1 mg 3 times a day, Ativan 1 mg every night, baclofen 20 medical twice a day, Depakote 500 mg every night, gabapentin 600 mg twice a day, oxycodone 30-10 mg every 3 hours, tizanidine 2 mg 2 tablets every 6 hours, his painful muscle spasm has much improved, he scheduled to have repeat IV ketamine infusion in June first for 10 days at [facility] again, his right upper extremity spasticity and pain has much improved with previous injection, most of pain is at  bilateral occipital, cervical, shoulder revision now  UPDATE Oct  2015: Last Botox injection in July 2015 has been helpful, but he has developed left neck weakness, lasting for one  month, he is under a lot of stress, his wife is dealing with metastatic squamous cell carcinoma, he has changed [name], worry about the possibility of continued ketamine infusion, which has been very helpful for him.  UPDATE Jan 27th 2016: Last injection was in October 2015, which has been helpful, today he complains of intermittent right more than left upper extremity muscle spasm, right hand finger spasm, tenderness,  bilateral neck, shoulder He also takes care of his wife, who has suffered metastatic cancer,  UPDATE Feb 22 2015: He is with his wife at today's clinical visit, last EMG guided injection was in September 2016, heel has suffered GI symptoms over the past few weeks, intractable nausea, with mild improvement now, his wife has finished her treatment, is now back to work at [employer]  UPDATE May 11th 2017: He saw his pain management physician at [name], he had multiple recurrent severe back muscle spasm, is now taking narcotics, baclofen, Botox continued to help his pain, he received 600 units of Botox injection in January 2017, no significant side effect noticed.  UPDATE August 17th 2017:  I reviewed [name] visit in Jun 15 2015,he received to right upper extremity interscalene nerve block under ultrasound guidance, which fail to improve his symptoms, actually made his symptoms worse, he was not able to accomplish physical therapy as previously planned.  It is hard to tell result from previous Botox injection in Jun 10 2015  UPDATE Jan 12 2016: He responded very well to previous injection in August 2017, over the past few months, he had frequent kidney stone.  UPDATE April 13 2015: He responded very well to previous Botox injection in December 2017,  UPDATE August 30 2016: He responded very well to previous injection on April 13 2015, in recent few weeks he noticed increased right hand finger cramping, deep achy pain,  Update January 04 2017: He responded well to previous injection, suffered kidney stone, no complaints of worsening left shoulder blade pain,  Update April 10, 2017: He is [name], who have suggested him to taper off clonazepam  UPDATE July 03 2017: He responded very well to previous injection, no significant side effect noticed, he also spend more time in the hot water tub,  which has been helpful  REVIEW OF SYSTEMS: Full 14 system review of systems performed and notable only for as above  ALLERGIES: No Known Allergies  HOME MEDICATIONS: Current Outpatient Medications  Medication Sig Dispense Refill  . baclofen (LIORESAL) 20 MG tablet Take 20 mg by mouth 2 (two) times daily.    . clonazePAM (KLONOPIN) 1 MG tablet Take 1 tablet (1 mg total) by mouth 2 (two) times daily. 180 tablet 1  . OnabotulinumtoxinA (BOTOX IJ) Inject 600 Units as directed.     . ondansetron (ZOFRAN) 4 MG tablet TAKE 1 TABLET BY MOUTH  EVERY 8 HOURS AS NEEDED FOR NAUSEA OR VOMITING 60 tablet 3  . OxyCODONE HCl (ROXICODONE PO) Take by mouth as directed.    . pregabalin (LYRICA) 150 MG capsule Take 150 mg by mouth 3 (three) times daily.    . TESTOSTERONE IM Inject 400 Units into the muscle every 30 (thirty) days.    Marland Kitchen. tiZANidine (ZANAFLEX) 2 MG tablet Take 2 tablets (4 mg total) by mouth every 6 (six) hours as needed for muscle spasms. 720 tablet 1  . XARELTO 20 MG TABS  tablet      No current facility-administered medications for this visit.     PAST MEDICAL HISTORY: Past Medical History:  Diagnosis Date  . Complex regional pain syndrome 04/23/2012  . Dystonia   . Dystonia 04/23/2012  . Falls   . Insomnia w/ sleep apnea   . Other fragments of torsion dystonia     PAST SURGICAL HISTORY: Past Surgical History:  Procedure Laterality Date  . KNEE SURGERY    . SHOULDER SURGERY      FAMILY HISTORY: Family History  Problem Relation Age of Onset  . Heart disease Mother   . Leukemia Father     SOCIAL HISTORY:  Social History   Socioeconomic History  . Marital status: Married    Spouse name: Not on file  . Number of children: 1  . Years of education: Not on file  . Highest education level: Not on file  Occupational History  . Not on file  Social Needs  . Financial resource strain: Not on file  . Food insecurity:    Worry: Not on file    Inability: Not on file  .  Transportation needs:    Medical: Not on file    Non-medical: Not on file  Tobacco Use  . Smoking status: Former Games developer  . Smokeless tobacco: Never Used  . Tobacco comment: quit 18 years ago  Substance and Sexual Activity  . Alcohol use: No  . Drug use: No  . Sexual activity: Not on file  Lifestyle  . Physical activity:    Days per week: Not on file    Minutes per session: Not on file  . Stress: Not on file  Relationships  . Social connections:    Talks on phone: Not on file    Gets together: Not on file    Attends religious service: Not on file    Active member of club or organization: Not on file    Attends meetings of clubs or organizations: Not on file    Relationship status: Not on file  . Intimate partner violence:    Fear of current or ex partner: Not on file    Emotionally abused: Not on file    Physically abused: Not on file    Forced sexual activity: Not on file  Other Topics Concern  . Not on file  Social History Narrative   Lives ath home w/ his wife     PHYSICAL EXAM   Vitals:   07/03/17 1604  BP: 138/88  Pulse: 96  Weight: 294 lb 8 oz (133.6 kg)  Height: 5\' 8"  (1.727 m)    Not recorded      Body mass index is 44.78 kg/m.    Physical Exam  General: Pleasant seated in no acute distress Respiratory: clear to ausculuation bilaterally Cardiovascular: regular rate, rhythm Musculoskeletal: Right upper extremity with dystonia  Neurologic Exam  Cranial Nerves: pupils were equal reactive to light. Visual fields full. EOM's intact. He has no facial muscleweakness. Hearing normal. Tongue and palate move normally Motor: He has neck rotated to right, right tilt, left shoulder elevation, right shoulder in anterior rotation, right elbow flexion and right hand MPI flexion, PPI and DPI extended position Sensory:  allodynia upon palpitation of neck, shoulder, upper back. Coordination: no dysmetria Gait and Station: tends to lean towards his right side, mild  dsytonic posture of his right leg.  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 EMG guided BOTOX A injection,  Used 600 units of Botox A   Right levator scapulae 25 units Right upper trapezius 25 times 2= 50 units Right rhomboid 25 units Right upper thoracic paraspinal muscles 25 units   Right splenius cervicus   25 x  3=75 Right splenius capitus  25 x2=50 units Right semispinalis 25 units Right iliocostalis 25 units Right levator scapular 25 units   Left levator scapulae 25 units Left upper trapezius 25 times 2= 50 units Left rhomboid 25 units Left upper thoracic paraspinal muscles 25 units Left splenius capitus  25 x2=50 units  Right rhomboid 50 units, Right pectoralis major 50 units  He will come back in  3 months for repeat injections.    Levert Feinstein, M.D. Ph.D.  [facility] [name], [address] [place], [place] [address] Ph: [phone] Fax: [phone]  CC: Referring Provider

## 2017-07-03 NOTE — Progress Notes (Signed)
**  Botox 100 units x 6 vials, NDC [id number], Lot [id number], Exp 10/2019, specialty pharmacy.//mck,rn**

## 2017-07-04 ENCOUNTER — Ambulatory Visit: Payer: Self-pay | Admitting: Neurology

## 2017-09-14 ENCOUNTER — Telehealth: Payer: Self-pay | Admitting: Neurology

## 2017-09-14 NOTE — Telephone Encounter (Signed)
I called to process a request for Botox refill for the patient, it is pending insurance. They will call to schedule when it is ready.

## 2017-09-24 ENCOUNTER — Telehealth: Payer: Self-pay | Admitting: Neurology

## 2017-09-24 NOTE — Telephone Encounter (Signed)
Patient would like a call back to discuss his Botox appointment he has on 09-26-17. He would not go into detail.

## 2017-09-24 NOTE — Telephone Encounter (Signed)
Spoke to patient - his car is in the shop and he needs to reschedule.  His Botox appt has been moved to 10/02/17.

## 2017-09-26 ENCOUNTER — Ambulatory Visit: Payer: Self-pay | Admitting: Neurology

## 2017-10-02 ENCOUNTER — Encounter: Payer: Self-pay | Admitting: Neurology

## 2017-10-02 ENCOUNTER — Ambulatory Visit (INDEPENDENT_AMBULATORY_CARE_PROVIDER_SITE_OTHER): Payer: Managed Care, Other (non HMO) | Admitting: Neurology

## 2017-10-02 VITALS — BP 130/71 | HR 77 | Ht 68.0 in | Wt 293.5 lb

## 2017-10-02 DIAGNOSIS — G90519 Complex regional pain syndrome I of unspecified upper limb: Secondary | ICD-10-CM

## 2017-10-02 DIAGNOSIS — G249 Dystonia, unspecified: Secondary | ICD-10-CM

## 2017-10-02 DIAGNOSIS — G243 Spasmodic torticollis: Secondary | ICD-10-CM

## 2017-10-02 NOTE — Progress Notes (Signed)
PATIENT: Brandon Lin Humes DOB: 1970/04/30  Chief Complaint  Patient presents with  . CRPS/Cervical Dystonia    Botox 100 units x 6 vials - specialty pharmacy     HISTORICAL  Brandon Lin Sperry, is a 47 year-old right-handed Caucasian male, has suffered complex regional pain syndrome following a right shoulder injury in 1998, he is here every 3 months for EMG guided Botox injection for his neck, shoulder pain, right upper extremity spasticity.  He fell off a deck and landed on his right neck and shoulder area in 1998, he had recurrent right shoulder pain, later he had multiple surgeries, each procedure making his symptoms worse, he fell off landed on his right shoulder again in 2005, he has developed intractable pain in his right shoulder and neck, dystonic postural, eventually was diagnosed with complex regional pain syndrome. From 2005 to 2007, he suffered severe neck pain, bilateral shoulder pain, abnormal posture of his neck, right upper extremity,to the point of difficult walking, fell many times.  He has been under the care of Dr. Jodi Mourningeynold since 2006, receiving Botox injections every 90 days, which does help his right neck and shoulder pain.  [name] anesthesiologist Dr. Genelle BalSteven Blau, he had significant pain around early 2013, he began to receive inpatient ketamine infusion since March 2013, every 3 months, initially spreading over 10 days with stable daily osage, slowly infusion 4 hours each day, but in August 2014, the infusion was cut back to 5 days, he most recent infusion in January 2015, he only received 3 days infusion.  He is on polypharmacy treatment,  He is taking baclofen.,Neurontin, Ativan 1 mg 3 times a day,  clonazepam, tizanidine, OxyCodone,  dosage is adjusted following his most recent hospital admission for pain management [name] in December 2014, currently 30 plus 10 mg every 3 hours, He is also receiving IV ketamine treatment  every 6 [name]  His father died of leukemia at age 48, mother  is healthy. He is disabled due to his condition. Married, has a son. 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    BOTOX has been very helpful, but he continues to have right neck, left neck and right  shoulder spastic pain, wearing off in 2 months.   He continues to get [facility],   There was some changes of his ketamine schedule, he got  3 days iv ketamine in Dec 2014 instead of previously scheduled 10 days, shortly after the injection, around January 2015, he developed frequent painful body muscle jerking movement, we have telephone adjusted his medications, is now taking regular dose of clonazepam 1 mg 3 times a day, Ativan 1 mg every night, baclofen 20 medical twice a day, Depakote 500 mg every night, gabapentin 600 mg twice a day, oxycodone 30-10 mg every 3 hours, tizanidine 2 mg 2 tablets every 6 hours, his painful muscle spasm has much improved, he scheduled to have repeat IV ketamine infusion in June first for 10 days [name] again, his right upper extremity spasticity and pain has much improved with previous injection, most of pain is at  bilateral occipital, cervical, shoulder revision now  UPDATE Oct  2015: Last Botox injection in July 2015 has been helpful, but he has developed left neck weakness, lasting for one  month, he is under a lot of stress, his wife is dealing with metastatic squamous cell carcinoma, he has changed to a new physician at [facility], worry about the possibility of continued ketamine infusion, which has been very helpful for him.  UPDATE Jan 27th 2016: Last injection was in October 2015, which has been helpful, today he complains of intermittent right more than left upper extremity muscle spasm, right hand finger spasm, tenderness,  bilateral neck, shoulder He also takes care of his wife, who has suffered metastatic cancer,  UPDATE Feb 22 2015: He is with his wife at today's clinical visit, last EMG guided injection was in September 2016, heel has suffered GI symptoms over the past few weeks, intractable nausea, with mild improvement now, his wife has finished her treatment, is now back to work at [employer]  UPDATE May 11th 2017: He saw his pain management physician at [name],will be admitted to [facility] for inpatient pain control, he had multiple recurrent severe back muscle spasm, is now taking narcotics, baclofen, Botox continued to help his pain, he received 600 units of Botox injection in January 2017, no significant side effect noticed.  UPDATE August 17th 2017:  I reviewed [facility] visit in Jun 15 2015,he received to right upper extremity interscalene nerve block under ultrasound guidance, which fail to improve his symptoms, actually made his symptoms worse, he was not able to accomplish physical therapy as previously planned.  It is hard to tell result from previous Botox injection in Jun 10 2015  UPDATE Jan 12 2016: He responded very well to previous injection in August 2017, over the past few months, he had frequent kidney stone.  UPDATE April 13 2015: He responded very well to previous Botox injection in December 2017,  UPDATE August 30 2016: He responded very well to previous injection on April 13 2015, in recent few weeks he noticed increased right hand finger cramping, deep achy pain,  Update January 04 2017: He responded well to previous injection, suffered kidney stone, no complaints of worsening left shoulder blade pain,  Update April 10, 2017: He is seeing new pain management data [name], who have suggested him to taper off clonazepam  UPDATE July 03 2017: He responded very well to previous injection, no significant side effect noticed, he also spend more time in the hot water tub,  which has been helpful  UPDATE Sept 3 2019: He responded very well to previous Botox injection until a week ago, then he began to have recurrent bilateral neck, shoulder pain.  REVIEW OF SYSTEMS: Full 14 system review of systems performed and notable only for as above  ALLERGIES: No Known Allergies  HOME MEDICATIONS: Current Outpatient Medications  Medication Sig Dispense Refill  . baclofen (LIORESAL) 20 MG tablet Take 20 mg by mouth 2 (two) times daily.    . clonazePAM (KLONOPIN) 1 MG tablet Take 1 tablet (1 mg total) by mouth 2 (two) times daily. 180 tablet 1  . OnabotulinumtoxinA (BOTOX IJ) Inject 600 Units as directed.     . ondansetron (ZOFRAN) 4 MG tablet TAKE 1 TABLET BY MOUTH  EVERY 8 HOURS AS NEEDED FOR NAUSEA OR VOMITING 60 tablet 3  . OxyCODONE HCl (ROXICODONE PO) Take by mouth as directed.    . pregabalin (LYRICA) 150 MG capsule Take 150 mg by mouth 3 (three) times daily.    . TESTOSTERONE IM Inject 400 Units into the muscle every 30 (thirty) days.    Marland Kitchen tiZANidine (ZANAFLEX) 2 MG  tablet Take 2 tablets (4 mg total) by mouth every 6 (six) hours as needed for muscle spasms. 720 tablet 1  . XARELTO 20 MG TABS tablet      No current facility-administered medications for this visit.     PAST MEDICAL HISTORY: Past Medical History:  Diagnosis Date  . Complex regional pain syndrome 04/23/2012  . Dystonia   . Dystonia 04/23/2012  . Falls   . Insomnia w/ sleep apnea   . Other fragments of torsion dystonia     PAST SURGICAL HISTORY: Past Surgical History:  Procedure Laterality Date  . KNEE SURGERY    . SHOULDER SURGERY      FAMILY HISTORY: Family History  Problem Relation Age of Onset  . Heart disease Mother   . Leukemia Father     SOCIAL HISTORY:  Social History   Socioeconomic History  . Marital status: Married    Spouse name: Not on file  . Number of children: 1  . Years of education: Not on file  . Highest education level: Not on file  Occupational  History  . Not on file  Social Needs  . Financial resource strain: Not on file  . Food insecurity:    Worry: Not on file    Inability: Not on file  . Transportation needs:    Medical: Not on file    Non-medical: Not on file  Tobacco Use  . Smoking status: Former Games developer  . Smokeless tobacco: Never Used  . Tobacco comment: quit 18 years ago  Substance and Sexual Activity  . Alcohol use: No  . Drug use: No  . Sexual activity: Not on file  Lifestyle  . Physical activity:    Days per week: Not on file    Minutes per session: Not on file  . Stress: Not on file  Relationships  . Social connections:    Talks on phone: Not on file    Gets together: Not on file    Attends religious service: Not on file    Active member of club or organization: Not on file    Attends meetings of clubs or organizations: Not on file    Relationship status: Not on file  . Intimate partner violence:    Fear of current or ex partner: Not on file    Emotionally abused: Not on file    Physically abused: Not on file    Forced sexual activity: Not on file  Other Topics Concern  . Not on file  Social History Narrative   Lives ath home w/ his wife     PHYSICAL EXAM   Vitals:   10/02/17 1024  Height: 5\' 8"  (1.727 m)    Not recorded      Body mass index is 44.78 kg/m.    Physical Exam  General: Pleasant seated in no acute distress Respiratory: clear to ausculuation bilaterally Cardiovascular: regular rate, rhythm Musculoskeletal: Right upper extremity with dystonia  Neurologic Exam  Cranial Nerves: pupils were equal reactive to light. Visual fields full. EOM's intact. He has no facial muscleweakness. Hearing normal. Tongue and palate move normally Motor: He has neck rotated to right, right tilt, left shoulder elevation, right shoulder in anterior rotation, right elbow flexion and right hand MPI flexion, PPI and DPI extended position Sensory:  allodynia upon palpitation of neck, shoulder,  upper back. Coordination: no dysmetria Gait and Station: tends to lean towards his right side, mild dsytonic posture of his right leg.  Assessment and Plan:  47  yo with complex regional pain syndrome, dystonia of right neck and right upper extremity. EMG guided BOTOX A injection,  Used 600 units of Botox A   Right levator scapulae 25 units Right upper trapezius 25 times 2= 50 units Right rhomboid 25 units Right upper thoracic paraspinal muscles 25 units   Right splenius cervicus   25 x 4= 100 Right splenius capitus  25 x2=50 units Right semispinalis 25 units Right iliocostalis 25 units Right levator scapular 25 units   Left levator scapulae 25 units Left upper trapezius 25 times 2= 50 units Left rhomboid 25 units Left upper thoracic paraspinal muscles 25x2=50 units Left splenius capitus  25 x2=50 units  Right rhomboid 50 units,   He will come back in  3 months for repeat injections.    Levert Feinstein, M.D. Ph.D.  [facility] [address], [address] [place], [place] [address] Ph: [phone] Fax: [phone]  CC: Referring Provider

## 2017-10-02 NOTE — Progress Notes (Signed)
**  Botox 100 units x 6 vials, NDC 6333-5456-25, Lot [id number], Exp 03/2020, specialty pharmacy.//mck,rn**

## 2017-12-13 ENCOUNTER — Telehealth: Payer: Self-pay | Admitting: Neurology

## 2017-12-13 NOTE — Telephone Encounter (Signed)
Botox letter regarding Specialty Pharmacy mailed to patient °

## 2018-01-02 ENCOUNTER — Ambulatory Visit: Payer: Self-pay | Admitting: Neurology

## 2018-01-09 ENCOUNTER — Ambulatory Visit: Payer: Self-pay | Admitting: Neurology

## 2018-01-16 ENCOUNTER — Ambulatory Visit: Payer: Self-pay | Admitting: Neurology

## 2018-01-17 ENCOUNTER — Telehealth: Payer: Self-pay | Admitting: Neurology

## 2018-01-17 NOTE — Telephone Encounter (Signed)
Pt states that he is experiencing something and he would like to discuss it with RN Michelle(pt chose not to go into further detail).  Please call

## 2018-01-17 NOTE — Telephone Encounter (Signed)
Spoke to patient - he has an appt on 01/21/18 at 10am.  He has an unavoidable conflict and would like to know if there is a later time available.  I was able to get his appt time switched with another patient.  His appt is now at 11:30am.  He was very Adult nurseappreciative.

## 2018-01-21 ENCOUNTER — Ambulatory Visit: Payer: Managed Care, Other (non HMO) | Admitting: Neurology

## 2018-01-21 ENCOUNTER — Telehealth: Payer: Self-pay | Admitting: Neurology

## 2018-01-21 ENCOUNTER — Encounter: Payer: Self-pay | Admitting: Neurology

## 2018-01-21 ENCOUNTER — Ambulatory Visit: Payer: Self-pay | Admitting: Neurology

## 2018-01-21 VITALS — BP 152/96 | HR 106 | Ht 68.0 in | Wt 249.5 lb

## 2018-01-21 DIAGNOSIS — M549 Dorsalgia, unspecified: Secondary | ICD-10-CM

## 2018-01-21 DIAGNOSIS — G243 Spasmodic torticollis: Secondary | ICD-10-CM | POA: Diagnosis not present

## 2018-01-21 DIAGNOSIS — G249 Dystonia, unspecified: Secondary | ICD-10-CM | POA: Diagnosis not present

## 2018-01-21 DIAGNOSIS — R202 Paresthesia of skin: Secondary | ICD-10-CM

## 2018-01-21 NOTE — Progress Notes (Signed)
PATIENT: Brandon Lin Humes DOB: 1970/04/30  Chief Complaint  Patient presents with  . CRPS/Cervical Dystonia    Botox 100 units x 6 vials - specialty pharmacy     HISTORICAL  Brandon Lin Sperry, is a 47 year-old right-handed Caucasian male, has suffered complex regional pain syndrome following a right shoulder injury in 1998, he is here every 3 months for EMG guided Botox injection for his neck, shoulder pain, right upper extremity spasticity.  He fell off a deck and landed on his right neck and shoulder area in 1998, he had recurrent right shoulder pain, later he had multiple surgeries, each procedure making his symptoms worse, he fell off landed on his right shoulder again in 2005, he has developed intractable pain in his right shoulder and neck, dystonic postural, eventually was diagnosed with complex regional pain syndrome. From 2005 to 2007, he suffered severe neck pain, bilateral shoulder pain, abnormal posture of his neck, right upper extremity,to the point of difficult walking, fell many times.  He has been under the care of Dr. Jodi Mourningeynold since 2006, receiving Botox injections every 90 days, which does help his right neck and shoulder pain.  Also under [name] anesthesiologist Dr. Genelle BalSteven Blau, he had significant pain around early 2013, he began to receive inpatient ketamine infusion since March 2013, every 3 months, initially spreading over 10 days with stable daily osage, slowly infusion 4 hours each day, but in August 2014, the infusion was cut back to 5 days, he most recent infusion in January 2015, he only received 3 days infusion.  He is on polypharmacy treatment,  He is taking baclofen.,Neurontin, Ativan 1 mg 3 times a day,  clonazepam, tizanidine, OxyCodone,  dosage is adjusted following his most recent hospital admission for pain management at [name] in December 2014, currently 30 plus 10 mg every 3 hours, He is also receiving IV ketamine treatment  every 6 months [name]  His father died of leukemia at age 48, mother  is healthy. He is disabled due to his condition. Married, has a son. 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    BOTOX has been very helpful, but he continues to have right neck, left neck and right  shoulder spastic pain, wearing off in 2 months.   He continues to get [facility] infusion [facility],   There was some changes of his ketamine schedule, he got  3 days iv ketamine in Dec 2014 instead of previously scheduled 10 days, shortly after the injection, around January 2015, he developed frequent painful body muscle jerking movement, we have telephone adjusted his medications, is now taking regular dose of clonazepam 1 mg 3 times a day, Ativan 1 mg every night, baclofen 20 medical twice a day, Depakote 500 mg every night, gabapentin 600 mg twice a day, oxycodone 30-10 mg every 3 hours, tizanidine 2 mg 2 tablets every 6 hours, his painful muscle spasm has much improved, he scheduled to have repeat IV ketamine infusion in June first for 10 days at [name] again, his right upper extremity spasticity and pain has much improved with previous injection, most of pain is at  bilateral occipital, cervical, shoulder revision now  UPDATE Oct  2015: Last Botox injection in July 2015 has been helpful, but he has developed left neck weakness, lasting for one  month, he is under a lot of stress, his wife is dealing with metastatic squamous cell carcinoma, he has changed to a new [name], worry about the possibility of continued ketamine infusion, which has been very helpful for him.  UPDATE Jan 27th 2016: Last injection was in October 2015, which has been helpful, today he complains of intermittent right more than left upper extremity muscle spasm, right hand finger spasm, tenderness,  bilateral neck, shoulder He also takes care of his wife, who has suffered metastatic cancer,  UPDATE Feb 22 2015: He is with his wife at today's clinical visit, last EMG guided injection was in September 2016, heel has suffered GI symptoms over the past few weeks, intractable nausea, with mild improvement now, his wife has finished her treatment, is now back to work at [employer]  UPDATE May 11th 2017: He saw his pain management physician at [name] be [name], he had multiple recurrent severe back muscle spasm, is now taking narcotics, baclofen, Botox continued to help his pain, he received 600 units of Botox injection in January 2017, no significant side effect noticed.  UPDATE August 17th 2017:  I reviewed [name] in Jun 15 2015,he received to right upper extremity interscalene nerve block under ultrasound guidance, which fail to improve his symptoms, actually made his symptoms worse, he was not able to accomplish physical therapy as previously planned.  It is hard to tell result from previous Botox injection in Jun 10 2015  UPDATE Jan 12 2016: He responded very well to previous injection in August 2017, over the past few months, he had frequent kidney stone.  UPDATE April 13 2015: He responded very well to previous Botox injection in December 2017,  UPDATE August 30 2016: He responded very well to previous injection on April 13 2015, in recent few weeks he noticed increased right hand finger cramping, deep achy pain,  Update January 04 2017: He responded well to previous injection, suffered kidney stone, no complaints of worsening left shoulder blade pain,  Update April 10, 2017: [name], who have suggested him to taper off clonazepam  UPDATE July 03 2017: He responded very well to previous injection, no significant side effect noticed, he also spend more time in the hot water tub,  which has been helpful  UPDATE Sept 3 2019: He responded very well to previous Botox injection until a week ago, then he began to have recurrent bilateral neck, shoulder pain.  UPDATE Jan 21 2018: He suffered kidney stone, with severe flank pain, also since early December 2019 he noticed numbness between his nipple line and knee level, but denied weakness, denies bowel and bladder incontinence, he has worsening gait abnormality due to recent fall, worsening right knee pain, previous Botox injection has helped him,  REVIEW OF SYSTEMS: Full 14 system review of systems performed and notable only for as above  ALLERGIES: Allergies  Allergen Reactions  . Diclofenac Sodium     HOME MEDICATIONS: Current Outpatient Medications  Medication Sig Dispense Refill  . baclofen (LIORESAL) 20 MG tablet Take 20 mg by mouth 2 (two) times daily.    . OnabotulinumtoxinA (BOTOX IJ) Inject 600 Units as directed.     . ondansetron (ZOFRAN) 4 MG tablet TAKE 1 TABLET BY MOUTH  EVERY 8 HOURS AS NEEDED FOR NAUSEA OR VOMITING 60 tablet 3  . OxyCODONE HCl (ROXICODONE PO) Take by mouth as directed.    .Marland Kitchen  pregabalin (LYRICA) 150 MG capsule Take 150 mg by mouth 3 (three) times daily.    . promethazine (PHENERGAN) 25 MG tablet Take by mouth.    . tamsulosin (FLOMAX) 0.4 MG CAPS capsule Take 0.4 mg by mouth.    . TESTOSTERONE IM Inject 400 Units into the muscle every 30 (thirty) days.    Marland Kitchen. tiZANidine (ZANAFLEX) 2 MG tablet Take 2 tablets (4 mg total) by mouth every 6 (six) hours as needed for muscle spasms. 720 tablet 1  . XARELTO 20 MG TABS tablet      No current facility-administered medications for this visit.     PAST MEDICAL HISTORY: Past Medical History:  Diagnosis Date  . Complex regional pain syndrome 04/23/2012  . Dystonia   . Dystonia 04/23/2012  . Falls   . Insomnia w/ sleep apnea   . Other fragments of torsion dystonia     PAST SURGICAL HISTORY: Past Surgical History:  Procedure Laterality Date    . KNEE SURGERY    . SHOULDER SURGERY      FAMILY HISTORY: Family History  Problem Relation Age of Onset  . Heart disease Mother   . Leukemia Father     SOCIAL HISTORY:  Social History   Socioeconomic History  . Marital status: Married    Spouse name: Not on file  . Number of children: 1  . Years of education: Not on file  . Highest education level: Not on file  Occupational History  . Not on file  Social Needs  . Financial resource strain: Not on file  . Food insecurity:    Worry: Not on file    Inability: Not on file  . Transportation needs:    Medical: Not on file    Non-medical: Not on file  Tobacco Use  . Smoking status: Former Games developermoker  . Smokeless tobacco: Never Used  . Tobacco comment: quit 18 years ago  Substance and Sexual Activity  . Alcohol use: No  . Drug use: No  . Sexual activity: Not on file  Lifestyle  . Physical activity:    Days per week: Not on file    Minutes per session: Not on file  . Stress: Not on file  Relationships  . Social connections:    Talks on phone: Not on file    Gets together: Not on file    Attends religious service: Not on file    Active member of club or organization: Not on file    Attends meetings of clubs or organizations: Not on file    Relationship status: Not on file  . Intimate partner violence:    Fear of current or ex partner: Not on file    Emotionally abused: Not on file    Physically abused: Not on file    Forced sexual activity: Not on file  Other Topics Concern  . Not on file  Social History Narrative   Lives ath home w/ his wife     PHYSICAL EXAM   Vitals:   01/21/18 1144  BP: (!) 152/96  Pulse: (!) 106  Weight: 249 lb 8 oz (113.2 kg)  Height: 5\' 8"  (1.727 m)    Not recorded      Body mass index is 37.94 kg/m.    Physical Exam  General: Pleasant seated in no acute distress Respiratory: clear to ausculuation bilaterally Cardiovascular: regular rate, rhythm Musculoskeletal: Right  upper extremity with dystonia  Neurologic Exam  Cranial Nerves: pupils were equal reactive to light. Visual  fields full. EOM's intact. He has no facial muscleweakness. Hearing normal. Tongue and palate move normally Motor: He has neck rotated to right, right tilt, left shoulder elevation, right shoulder in anterior rotation, right elbow flexion and right hand MPI flexion, PPI and DPI extended position Sensory: Decreased light touch between the nipple line and knee level, allodynia of bilateral shoulder, posterior cervical region, Deep tendon reflex: Hyperreflexia of left patella, trace left ankle reflex, plantar responses were flexor bilaterally Coordination: no dysmetria Gait and Station: tends to lean towards his right side, mild dsytonic posture of his right leg.  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 EMG guided BOTOX A injection,  Used 600 units of Botox A   Right levator scapulae 25 units Right upper trapezius 25 times 2= 50 units Right rhomboid 25 units   Right splenius cervicus   25 x 4= 100 Right splenius capitus  25 x2=50 units Right iliocostalis 25 units Right levator scapular 25 units   Left levator scapulae 25 units Left splenius cervix 25 unitsx2=50 units Left upper trapezius 25 times 2= 50 units Left rhomboid 25 units Left splenius capitus  25 x2=50 units  He will come back in  3 months for repeat injections.  New onset thoracic pain following fall. Hyperreflexia on examinations, need to rule out cervical, and thoracic spine related myelopathy MRI cervical wo MRI thoracic spine wo      Levert Feinstein, M.D. Ph.D.  [facility] [address] [place], [place] [address] Ph: [phone] Fax: [phone]  CC: Referring Provider

## 2018-01-21 NOTE — Progress Notes (Signed)
**  Botox 100 units x 6 vials, NDC 9604-5409-810023-1145-01, Lot [id number], Exp 05/2020, specialty pharmacy.//mck,rn**

## 2018-01-21 NOTE — Telephone Encounter (Signed)
[employer] pending faxed clinical notes  °

## 2018-01-28 ENCOUNTER — Telehealth: Payer: Self-pay | Admitting: Neurology

## 2018-01-28 NOTE — Telephone Encounter (Signed)
Noted, his insurance [employer] is requiring a p2p.

## 2018-01-28 NOTE — Telephone Encounter (Signed)
This is a Dr. Terrace ArabiaYan patient   Brandon [employer]uer[employer] did not approve the MRI's. "Based on [employer] spine imaging guidelines, we cannot approve this request. Your records show that you have back and/or neck pain. They also show a request for a magnetic resonance imaging scan of your spine. This is a detailed picture study. An MRI scan is supported for your type of pain if one of the following applies to you. One, you failed to improve following a recent (within 3 months) 6 week trial of doctor prescribed treatment and you had follow up contact with your doctor to look at your progress after the 6 weeks. Follow up contact may be done by phone, mail or messaging. Two, you have severe weakness. Three, you have or have had cancer Four, you have an infection. Five, you have damage to a bundle of nerve roots at the lower end of the spinal cord (cauda equina) causing loss of function of the nerve roots at the bottom of your spine. Your recorders do not show that one or more of these apply to you.   There is an option for a peer to peer. Their phone number is [phone] there case number is [id number] and it has to be scheduled with [employer].

## 2018-01-28 NOTE — Telephone Encounter (Addendum)
Called [employer], spoke with Lia Hoppingatasha W to schedule peer to peer for MRI, Case [id number].  Scheduled peer to peer for 01/31/18, 11:00 am with  Dr Harden Moaramagna. Dr Marjory LiesPenumalli notified of Peer to Peer., appointment

## 2018-01-28 NOTE — Telephone Encounter (Signed)
Patient says [place] of place in [place] is [facility] telephone number [phone]. Please call patient if any other info is needed.

## 2018-01-31 ENCOUNTER — Telehealth: Payer: Self-pay | Admitting: Diagnostic Neuroimaging

## 2018-01-31 NOTE — Telephone Encounter (Signed)
[employer] Auth: [id number] (exp. 01/21/18 to 04/21/18) order faxed to [facility] they will reach out to the pt to schedule  °

## 2018-01-31 NOTE — Telephone Encounter (Signed)
Noted, thank you

## 2018-01-31 NOTE — Telephone Encounter (Signed)
  Auth # [id number]  Both scans approved.   -VRP

## 2018-01-31 NOTE — Telephone Encounter (Signed)
Rutherford Nail: [id number] (exp. 01/21/18 to 04/21/18) order faxed to [facility] they will reach out to the pt to schedule

## 2018-02-01 NOTE — Telephone Encounter (Signed)
pt scheduled at [facility] for 02/07/18 arrival time is 12:45 pm

## 2018-02-14 ENCOUNTER — Telehealth: Payer: Self-pay | Admitting: Neurology

## 2018-02-15 ENCOUNTER — Telehealth: Payer: Self-pay | Admitting: Neurology

## 2018-02-15 NOTE — Telephone Encounter (Signed)
MRI cervical spine on Feb 07 2018 showed mild left foraminal narrowing at all elves from C2 through C6. No spinal stenosis.  MRI thoracic spine without showed mild degenerative changes, no spinal canal or nerve root compression

## 2018-02-18 ENCOUNTER — Telehealth: Payer: Self-pay | Admitting: Neurology

## 2018-02-18 NOTE — Telephone Encounter (Signed)
MRI of cervical spine on December 09, 2018, mild left neural foraminal narrowing at all levels from C2-C6, no significant disc herniation canal stenosis

## 2018-02-19 NOTE — Telephone Encounter (Signed)
error 

## 2018-03-08 ENCOUNTER — Telehealth: Payer: Self-pay | Admitting: *Deleted

## 2018-03-08 NOTE — Telephone Encounter (Signed)
R/c [facility] cd of Cervical/Thoracic cd on [employer].

## 2018-04-03 ENCOUNTER — Other Ambulatory Visit: Payer: Self-pay | Admitting: Neurology

## 2018-04-05 NOTE — Telephone Encounter (Signed)
[facility] ph # [phone] Fax # [phone].

## 2018-04-22 ENCOUNTER — Telehealth: Payer: Self-pay | Admitting: Neurology

## 2018-04-23 NOTE — Telephone Encounter (Signed)
I called the patients insurance to begin a pre cert request. I filled out auth form on their website and sent it to Gerome Apley, to print and give to [employer].

## 2018-04-23 NOTE — Telephone Encounter (Signed)
Form completed, signed by Dr. Terrace Arabia and faxed back, along with clinicals.

## 2018-04-23 NOTE — Telephone Encounter (Signed)
I called [facility] at [phone] to request a refill. They needed a new prescription so I provided one over the phone.

## 2018-04-23 NOTE — Telephone Encounter (Signed)
I called the patient to make him aware of office closure but he did not answer and there was not option for VM. I called his wife (ok per DPR) and spoke with her to make her aware. She stated that the patient was already feeling bad so they would like to get him on as soon as possible and requested that we schedule something sooner rather than later because they cant make last minute apt due to living so far away. I told her we would call her back to get him scheduled shortly.

## 2018-04-24 ENCOUNTER — Ambulatory Visit: Payer: Self-pay | Admitting: Neurology

## 2018-04-25 NOTE — Telephone Encounter (Signed)
I called [facility] at [phone] to scheduled delivery for the patients Botox, [facility] is still pending patient consent. They have tried to reach him twice.

## 2018-04-29 NOTE — Telephone Encounter (Signed)
I have called twice to confirm delivery for tomorrow morning 04/30/18. DW

## 2018-04-29 NOTE — Telephone Encounter (Signed)
[id number] (04/23/18-04/23/19)

## 2018-04-30 ENCOUNTER — Encounter: Payer: Self-pay | Admitting: Neurology

## 2018-04-30 ENCOUNTER — Ambulatory Visit (INDEPENDENT_AMBULATORY_CARE_PROVIDER_SITE_OTHER): Payer: Managed Care, Other (non HMO) | Admitting: Neurology

## 2018-04-30 ENCOUNTER — Other Ambulatory Visit: Payer: Self-pay

## 2018-04-30 VITALS — BP 136/90 | HR 93 | Ht 68.0 in | Wt 245.5 lb

## 2018-04-30 DIAGNOSIS — G5640 Causalgia of unspecified upper limb: Secondary | ICD-10-CM | POA: Diagnosis not present

## 2018-04-30 DIAGNOSIS — G243 Spasmodic torticollis: Secondary | ICD-10-CM | POA: Diagnosis not present

## 2018-04-30 NOTE — Progress Notes (Signed)
**  Botox 100 units x 6 vials, NDC 1517-6160-73, Lot [id number], Exp 09/2020, specialty pharmacy.//mck,rn**

## 2018-04-30 NOTE — Progress Notes (Signed)
PATIENT: Brandon Lin DOB: 06-04-1970  Chief Complaint  Patient presents with  . CRPS, Cervical Dystonia    Botox 100 units x 6 vials - specialty pharmacy     HISTORICAL  Brandon Lin, is a 48 year-old right-handed Caucasian male, has suffered complex regional pain syndrome following a right shoulder injury in 1998, he is here every 3 months for EMG guided Botox injection for his neck, shoulder pain, right upper extremity spasticity.  He fell off a deck and landed on his right neck and shoulder area in 1998, he had recurrent right shoulder pain, later he had multiple surgeries, each procedure making his symptoms worse, he fell off landed on his right shoulder again in 2005, he has developed intractable pain in his right shoulder and neck, dystonic postural, eventually was diagnosed with complex regional pain syndrome. From 2005 to 2007, he suffered severe neck pain, bilateral shoulder pain, abnormal posture of his neck, right upper extremity,to the point of difficult walking, fell many times.  He has been under the care of Dr. Jodi Mourning since 2006, receiving Botox injections every 90 days, which does help his right neck and shoulder pain.  Also under pain management at [facility] anesthesiologist Dr. Genelle Bal, he had significant pain around early 2013, he began to receive inpatient ketamine infusion since March 2013, every 3 months, initially spreading over 10 days with stable daily osage, slowly infusion 4 hours each day, but in August 2014, the infusion was cut back to 5 days, he most recent infusion in January 2015, he only received 3 days infusion.  He is on polypharmacy treatment,  He is taking baclofen.,Neurontin, Ativan 1 mg 3 times a day,  clonazepam, tizanidine, OxyCodone,  dosage is adjusted following his most recent hospital admission for pain management at [facility] in December 2014, currently 30 plus 10 mg every 3 hours, He is also receiving IV ketamine treatment  every 6 months at [facility]  His father died of leukemia at age 79, mother  is healthy. He is disabled due to his condition. Married, has a son. 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    BOTOX has been very helpful, but he continues to have right neck, left neck and right  shoulder spastic pain, wearing off in 2 months.   He continues to get ketamine infusion from [facility],   There was some changes of his ketamine schedule, he got  3 days iv ketamine in Dec 2014 instead of previously scheduled 10 days, shortly after the injection, around January 2015, he developed frequent painful body muscle jerking movement, we have telephone adjusted his medications, is now taking regular dose of clonazepam 1 mg 3 times a day, Ativan 1 mg every night, baclofen 20 medical twice a day, Depakote 500 mg every night, gabapentin 600 mg twice a day, oxycodone 30-10 mg every 3 hours, tizanidine 2 mg 2 tablets every 6 hours, his painful muscle spasm has much improved, he scheduled to have repeat IV ketamine infusion in June first for 10 days at [facility] again, his right upper extremity spasticity and pain has much improved with previous injection, most of pain is at  bilateral occipital, cervical, shoulder revision now  UPDATE Oct  2015: Last Botox injection in July 2015 has been helpful, but he has developed left neck weakness, lasting for  one month, he is under a lot of stress, his wife is dealing with metastatic squamous cell carcinoma, he has changed to a new physician at [facility], worry about the possibility of continued ketamine infusion, which has been very helpful for him.  UPDATE Jan 27th 2016: Last injection was in October 2015, which has been helpful, today he complains of intermittent right more than left upper extremity muscle spasm, right hand finger spasm, tenderness,  bilateral neck, shoulder He also takes care of his wife, who has suffered metastatic cancer,  UPDATE Feb 22 2015: He is with his wife at today's clinical visit, last EMG guided injection was in September 2016, heel has suffered GI symptoms over the past few weeks, intractable nausea, with mild improvement now, his wife has finished her treatment, is now back to work at [employer]  UPDATE May 11th 2017: He saw his pain management physician at [name],will be admitted to [facility] for inpatient pain control, he had multiple recurrent severe back muscle spasm, is now taking narcotics, baclofen, Botox continued to help his pain, he received 600 units of Botox injection in January 2017, no significant side effect noticed.  UPDATE August 17th 2017:  I reviewed [facility] visit in Jun 15 2015,he received to right upper extremity interscalene nerve block under ultrasound guidance, which fail to improve his symptoms, actually made his symptoms worse, he was not able to accomplish physical therapy as previously planned.  It is hard to tell result from previous Botox injection in Jun 10 2015  UPDATE Jan 12 2016: He responded very well to previous injection in August 2017, over the past few months, he had frequent kidney stone.  UPDATE April 13 2015: He responded very well to previous Botox injection in December 2017,  UPDATE August 30 2016: He responded very well to previous injection on April 13 2015, in recent few weeks he noticed increased right hand finger cramping, deep achy pain,  Update January 04 2017: He responded well to previous injection, suffered kidney stone, no complaints of worsening left shoulder blade pain,  Update April 10, 2017: He is seeing new pain management data [name], who have suggested him to taper off clonazepam  UPDATE July 03 2017: He responded very well to previous injection, no significant side effect noticed, he also spend more time in the hot water tub,  which has been helpful  UPDATE Sept 3 2019: He responded very well to previous Botox injection until a week ago, then he began to have recurrent bilateral neck, shoulder pain.  UPDATE Jan 21 2018: He suffered kidney stone, with severe flank pain, also since early December 2019 he noticed numbness between his nipple line and knee level, but denied weakness, denies bowel and bladder incontinence, he has worsening gait abnormality due to recent fall, worsening right knee pain, previous Botox injection has helped him,  UPDATE April 30 2018: He responded very well to previous injection.  REVIEW OF SYSTEMS: Full 14 system review of systems performed and notable only for as above  ALLERGIES: Allergies  Allergen Reactions  . Diclofenac Sodium     HOME MEDICATIONS: Current Outpatient Medications  Medication Sig Dispense Refill  . baclofen (LIORESAL) 20 MG tablet Take 20 mg by mouth 2 (two) times daily.    . OnabotulinumtoxinA (BOTOX IJ) Inject 600 Units as directed every 3 (three) months.     . ondansetron (ZOFRAN) 4 MG tablet TAKE 1 TABLET BY MOUTH  EVERY 8 HOURS AS NEEDED FOR NAUSEA  OR VOMITING 60 tablet 3  . OxyCODONE HCl (ROXICODONE PO) Take by mouth as directed.    . pregabalin (LYRICA) 150 MG capsule Take 150 mg by mouth 3 (three) times daily.    . promethazine (PHENERGAN) 25 MG tablet Take by mouth.    . tamsulosin (FLOMAX) 0.4 MG CAPS capsule Take 0.4 mg by mouth.    . TESTOSTERONE IM Inject 400 Units into the muscle every 30 (thirty) days.    Marland Kitchen tiZANidine (ZANAFLEX) 2 MG tablet Take 2 tablets (4 mg total) by mouth every 6 (six) hours as needed for muscle spasms. 720 tablet 1  . XARELTO 20 MG TABS tablet      No current facility-administered medications for this visit.     PAST MEDICAL HISTORY: Past Medical History:  Diagnosis Date  . Complex regional pain syndrome 04/23/2012  . Dystonia   . Dystonia 04/23/2012  . Falls   . Insomnia w/ sleep apnea   . Other fragments of  torsion dystonia     PAST SURGICAL HISTORY: Past Surgical History:  Procedure Laterality Date  . KNEE SURGERY    . SHOULDER SURGERY      FAMILY HISTORY: Family History  Problem Relation Age of Onset  . Heart disease Mother   . Leukemia Father     SOCIAL HISTORY:  Social History   Socioeconomic History  . Marital status: Married    Spouse name: Not on file  . Number of children: 1  . Years of education: Not on file  . Highest education level: Not on file  Occupational History  . Not on file  Social Needs  . Financial resource strain: Not on file  . Food insecurity:    Worry: Not on file    Inability: Not on file  . Transportation needs:    Medical: Not on file    Non-medical: Not on file  Tobacco Use  . Smoking status: Former Games developer  . Smokeless tobacco: Never Used  . Tobacco comment: quit 18 years ago  Substance and Sexual Activity  . Alcohol use: No  . Drug use: No  . Sexual activity: Not on file  Lifestyle  . Physical activity:    Days per week: Not on file    Minutes per session: Not on file  . Stress: Not on file  Relationships  . Social connections:    Talks on phone: Not on file    Gets together: Not on file    Attends religious service: Not on file    Active member of club or organization: Not on file    Attends meetings of clubs or organizations: Not on file    Relationship status: Not on file  . Intimate partner violence:    Fear of current or ex partner: Not on file    Emotionally abused: Not on file    Physically abused: Not on file    Forced sexual activity: Not on file  Other Topics Concern  . Not on file  Social History Narrative   Lives ath home w/ his wife     PHYSICAL EXAM   Vitals:   04/30/18 1502  BP: 136/90  Pulse: 93  Weight: 245 lb 8 oz (111.4 kg)  Height:  (1.727 m)    Not recorded      Body mass index is 37.33 kg/m.  Right upper extremity with dystonia He has neck rotated to right, right tilt, left  shoulder elevation, right shoulder in anterior rotation, right elbow  flexion and right hand MPI flexion, PPI and DPI extended position  He tends to lean towards his right side, mild dsytonic posture of his right leg.  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 EMG guided BOTOX A injection,  Used 600 units of Botox A   Right levator scapulae 25 units Right upper trapezius 25 times x3= 75 units Right rhomboid 25 x2=50units Right iliocostalis 25 x2=50units Right levator scapular 25 units   Left levator scapulae 25 units Left upper trapezius 25 times x3= 75units Left rhomboid 25x3=75 units  Right splenius cervix 25 x2=50 units Left splenius cervix 25x2=50 units  Right upper thoracic 25x2=50 units Left upper thoracic 25x2=50 units    He will come back in  3 months for repeat injections.      Levert Feinstein, M.D. Ph.D.  [facility] [address], [address] [place], [place] [address] Ph: [phone] Fax: [phone]  CC: Referring Provider

## 2018-07-31 ENCOUNTER — Ambulatory Visit (INDEPENDENT_AMBULATORY_CARE_PROVIDER_SITE_OTHER): Payer: Managed Care, Other (non HMO) | Admitting: Neurology

## 2018-07-31 ENCOUNTER — Encounter: Payer: Self-pay | Admitting: Neurology

## 2018-07-31 ENCOUNTER — Other Ambulatory Visit: Payer: Self-pay

## 2018-07-31 VITALS — BP 122/61 | HR 78 | Temp 97.8°F | Ht 68.0 in | Wt 252.0 lb

## 2018-07-31 DIAGNOSIS — G243 Spasmodic torticollis: Secondary | ICD-10-CM

## 2018-07-31 DIAGNOSIS — G5640 Causalgia of unspecified upper limb: Secondary | ICD-10-CM

## 2018-07-31 NOTE — Progress Notes (Signed)
**  Botox 100 units x 6 vials, NDC 0761-5183-43, Lot [id number], Exp 03/2021, specialty pharmacy.//mck,rn**

## 2018-07-31 NOTE — Progress Notes (Signed)
PATIENT: Brandon Lin DOB: 03-04-1970  Chief Complaint  Patient presents with  . CRPS/Cervical Dystonia    Botox 100 units x 6 vials - specialty pharmacy     HISTORICAL  Brandon Lin, is a 48 year-old right-handed Caucasian male, has suffered complex regional pain syndrome following a right shoulder injury in 1998, he is here every 3 months for EMG guided Botox injection for his neck, shoulder pain, right upper extremity spasticity.  He fell off a deck and landed on his right neck and shoulder area in 1998, he had recurrent right shoulder pain, later he had multiple surgeries, each procedure making his symptoms worse, he fell off landed on his right shoulder again in 2005, he has developed intractable pain in his right shoulder and neck, dystonic postural, eventually was diagnosed with complex regional pain syndrome. From 2005 to 2007, he suffered severe neck pain, bilateral shoulder pain, abnormal posture of his neck, right upper extremity,to the point of difficult walking, fell many times.  He has been under the care of Dr. Jodi Mourning since 2006, receiving Botox injections every 90 days, which does help his right neck and shoulder pain.  Also under pain management at [facility] anesthesiologist Dr. Genelle Bal, he had significant pain around early 2013, he began to receive inpatient ketamine infusion since March 2013, every 3 months, initially spreading over 10 days with stable daily osage, slowly infusion 4 hours each day, but in August 2014, the infusion was cut back to 5 days, he most recent infusion in January 2015, he only received 3 days infusion.  He is on polypharmacy treatment,  He is taking baclofen.,Neurontin, Ativan 1 mg 3 times a day,  clonazepam, tizanidine, OxyCodone,  dosage is adjusted following his most recent hospital admission for pain management at [facility] in December 2014, currently 30 plus 10 mg every 3 hours, He is also receiving IV ketamine treatment  every 6 months at [facility]  His father died of leukemia at age 55, mother  is healthy. He is disabled due to his condition. Married, has a son. 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    BOTOX has been very helpful, but he continues to have right neck, left neck and right  shoulder spastic pain, wearing off in 2 months.   He continues to get ketamine infusion from [facility],   There was some changes of his ketamine schedule, he got  3 days iv ketamine in Dec 2014 instead of previously scheduled 10 days, shortly after the injection, around January 2015, he developed frequent painful body muscle jerking movement, we have telephone adjusted his medications, is now taking regular dose of clonazepam 1 mg 3 times a day, Ativan 1 mg every night, baclofen 20 medical twice a day, Depakote 500 mg every night, gabapentin 600 mg twice a day, oxycodone 30-10 mg every 3 hours, tizanidine 2 mg 2 tablets every 6 hours, his painful muscle spasm has much improved, he scheduled to have repeat IV ketamine infusion in June first for 10 days at [facility] again, his right upper extremity spasticity and pain has much improved with previous injection, most of pain is at  bilateral occipital, cervical, shoulder revision now  UPDATE Oct  2015: Last Botox injection in July 2015 has been helpful, but he has developed left neck weakness, lasting for one  month, he is under a lot of stress, his wife is dealing with metastatic squamous cell carcinoma, he has changed to a new physician at [facility], worry about the possibility of continued ketamine infusion, which has been very helpful for him.  UPDATE Jan 27th 2016: Last injection was in October 2015, which has been helpful, today he complains of intermittent right more than left upper extremity muscle spasm, right hand finger spasm, tenderness,  bilateral neck, shoulder He also takes care of his wife, who has suffered metastatic cancer,  UPDATE Feb 22 2015: He is with his wife at today's clinical visit, last EMG guided injection was in September 2016, heel has suffered GI symptoms over the past few weeks, intractable nausea, with mild improvement now, his wife has finished her treatment, is now back to work at [employer]  UPDATE May 11th 2017: He saw his pain management physician at [name],will be admitted to [facility] for inpatient pain control, he had multiple recurrent severe back muscle spasm, is now taking narcotics, baclofen, Botox continued to help his pain, he received 600 units of Botox injection in January 2017, no significant side effect noticed.  UPDATE August 17th 2017:  I reviewed [facility] visit in Jun 15 2015,he received to right upper extremity interscalene nerve block under ultrasound guidance, which fail to improve his symptoms, actually made his symptoms worse, he was not able to accomplish physical therapy as previously planned.  It is hard to tell result from previous Botox injection in Jun 10 2015  UPDATE Jan 12 2016: He responded very well to previous injection in August 2017, over the past few months, he had frequent kidney stone.  UPDATE April 13 2015: He responded very well to previous Botox injection in December 2017,  UPDATE August 30 2016: He responded very well to previous injection on April 13 2015, in recent few weeks he noticed increased right hand finger cramping, deep achy pain,  Update January 04 2017: He responded well to previous injection, suffered kidney stone, no complaints of worsening left shoulder blade pain,  Update April 10, 2017: He is seeing new pain management data [name], who have suggested him to taper off clonazepam  UPDATE July 03 2017: He responded very well to previous injection, no significant side effect noticed, he also spend more time in the hot water tub,  which has been helpful  UPDATE Sept 3 2019: He responded very well to previous Botox injection until a week ago, then he began to have recurrent bilateral neck, shoulder pain.  UPDATE Jan 21 2018: He suffered kidney stone, with severe flank pain, also since early December 2019 he noticed numbness between his nipple line and knee level, but denied weakness, denies bowel and bladder incontinence, he has worsening gait abnormality due to recent fall, worsening right knee pain, previous Botox injection has helped him,  UPDATE April 30 2018: He responded very well to previous injection.  UPDATE July 31 2018: He responded very well to previous injection  REVIEW OF SYSTEMS: Full 14 system review of systems performed and notable only for as above  ALLERGIES: Allergies  Allergen Reactions  . Diclofenac Sodium     HOME MEDICATIONS: Current Outpatient Medications  Medication Sig Dispense Refill  . baclofen (LIORESAL) 20 MG tablet Take 20 mg by mouth 2 (two) times daily.    . OnabotulinumtoxinA (BOTOX IJ) Inject 600 Units as directed every 3 (three) months.     . ondansetron (ZOFRAN) 4 MG tablet TAKE 1  TABLET BY MOUTH  EVERY 8 HOURS AS NEEDED FOR NAUSEA OR VOMITING 60 tablet 3  . OxyCODONE HCl (ROXICODONE PO) Take by mouth as directed.    . pregabalin (LYRICA) 150 MG capsule Take 150 mg by mouth 3 (three) times daily.    . promethazine (PHENERGAN) 25 MG tablet Take by mouth.    . tamsulosin (FLOMAX) 0.4 MG CAPS capsule Take 0.4 mg by mouth.    . TESTOSTERONE IM Inject 400 Units into the muscle every 30 (thirty) days.    Marland Kitchen tiZANidine (ZANAFLEX) 2 MG tablet Take 2 tablets (4 mg total) by mouth every 6 (six) hours as needed for muscle spasms. 720 tablet 1  . XARELTO 20 MG TABS tablet      No current facility-administered medications for this visit.     PAST MEDICAL HISTORY: Past Medical History:  Diagnosis Date  . Complex regional pain syndrome 04/23/2012  . Dystonia   . Dystonia 04/23/2012   . Falls   . Insomnia w/ sleep apnea   . Other fragments of torsion dystonia     PAST SURGICAL HISTORY: Past Surgical History:  Procedure Laterality Date  . KNEE SURGERY    . SHOULDER SURGERY      FAMILY HISTORY: Family History  Problem Relation Age of Onset  . Heart disease Mother   . Leukemia Father     SOCIAL HISTORY:  Social History   Socioeconomic History  . Marital status: Married    Spouse name: Not on file  . Number of children: 1  . Years of education: Not on file  . Highest education level: Not on file  Occupational History  . Not on file  Social Needs  . Financial resource strain: Not on file  . Food insecurity    Worry: Not on file    Inability: Not on file  . Transportation needs    Medical: Not on file    Non-medical: Not on file  Tobacco Use  . Smoking status: Former Games developer  . Smokeless tobacco: Never Used  . Tobacco comment: quit 18 years ago  Substance and Sexual Activity  . Alcohol use: No  . Drug use: No  . Sexual activity: Not on file  Lifestyle  . Physical activity    Days per week: Not on file    Minutes per session: Not on file  . Stress: Not on file  Relationships  . Social Musician on phone: Not on file    Gets together: Not on file    Attends religious service: Not on file    Active member of club or organization: Not on file    Attends meetings of clubs or organizations: Not on file    Relationship status: Not on file  . Intimate partner violence    Fear of current or ex partner: Not on file    Emotionally abused: Not on file    Physically abused: Not on file    Forced sexual activity: Not on file  Other Topics Concern  . Not on file  Social History Narrative   Lives ath home w/ his wife     PHYSICAL EXAM   Vitals:   07/31/18 1417  BP: 122/61  Pulse: 78  Temp: 97.8 F (36.6 C)  Weight: 252 lb (114.3 kg)  Height: 5\' 8"  (1.727 m)    Not recorded      Body mass index is 38.32 kg/m.    Regional complex pain syndrome Right upper extremity  and cervical dystonia  He has neck rotated to right, right tilt, left shoulder elevation, right shoulder in anterior rotation, right elbow flexion and right hand MPI flexion, PPI and DPI extended position  He tends to lean towards his right side, mild dsytonic posture of his right leg.  Assessment and Plan:  EMG guided BOTOX A injection,  Used 600 units of Botox A   Right levator scapulae 25 units Right upper trapezius 25   Right splenius cervix 25 x4=100 units Right iliocostalis 25 units Right splenius capitis 75 units Right Rhomboid 50 units  Left levator scapulae 25 units Left upper trapezius 25  units Left rhomboid 50  Left iliocostalis 50  Left splenius capitus 25 x 3=75 units Left splenius cervix 25x 3=75 units    He will come back in  3 months for repeat injections.      Levert Feinstein, M.D. Ph.D.  [facility] [address] [place], [place] [address] Ph: [phone] Fax: [phone]  CC: Referring Provider

## 2018-11-06 ENCOUNTER — Ambulatory Visit (INDEPENDENT_AMBULATORY_CARE_PROVIDER_SITE_OTHER): Payer: Managed Care, Other (non HMO) | Admitting: Neurology

## 2018-11-06 ENCOUNTER — Other Ambulatory Visit: Payer: Self-pay

## 2018-11-06 VITALS — BP 128/82 | HR 76 | Temp 98.1°F | Wt 253.5 lb

## 2018-11-06 DIAGNOSIS — G243 Spasmodic torticollis: Secondary | ICD-10-CM

## 2018-11-06 DIAGNOSIS — G5640 Causalgia of unspecified upper limb: Secondary | ICD-10-CM | POA: Insufficient documentation

## 2018-11-06 NOTE — Progress Notes (Signed)
100 vials x6 vials Lot number [id number] Expires 04/2021 [facility] [id number] Speciality pharmacy

## 2018-11-06 NOTE — Progress Notes (Signed)
PATIENT: Brandon Lin DOB: 1970/10/24  HISTORICAL  Brandon Lin, is a 48 year-old right-handed Caucasian male, has suffered complex regional pain syndrome following a right shoulder injury in 1998, he is here every 3 months for EMG guided Botox injection for his neck, shoulder pain, right upper extremity spasticity.  He fell off a deck and landed on his right neck and shoulder area in 1998, he had recurrent right shoulder pain, later he had multiple surgeries, each procedure making his symptoms worse, he fell off landed on his right shoulder again in 2005, he has developed intractable pain in his right shoulder and neck, dystonic postural, eventually was diagnosed with complex regional pain syndrome. From 2005 to 2007, he suffered severe neck pain, bilateral shoulder pain, abnormal posture of his neck, right upper extremity,to the point of difficult walking, fell many times.  He has been under the care of Dr. Tyron Lin since 2006, receiving Botox injections every 90 days, which does help his right neck and shoulder pain.  Also under pain management at [facility] anesthesiologist Dr. Ilona Lin, he had significant pain around early 2013, he began to receive inpatient ketamine infusion since March 2013, every 3 months, initially spreading over 10 days with stable daily osage, slowly infusion 4 hours each day, but in August 2014, the infusion was cut back to 5 days, he most recent infusion in January 2015, he only received 3 days infusion.  He is on polypharmacy treatment,  He is taking baclofen.,Neurontin, Ativan 1 mg 3 times a day,  clonazepam, tizanidine, OxyCodone,  dosage is adjusted following his most recent [facility] admission for pain management at [facility] in December 2014, currently 30 plus 10 mg every 3 hours, He is also receiving IV ketamine treatment every 6 months at [facility]  His father died of leukemia at age 75, mother  is healthy. He is disabled due to his condition.  Married, has a son. 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    BOTOX has been very helpful, but he continues to have right neck, left neck and right  shoulder spastic pain, wearing off in 2 months.   He continues to get ketamine infusion from [facility],   There was some changes of his ketamine schedule, he got  3 days iv ketamine in Dec 2014 instead of previously scheduled 10 days, shortly after the injection, around January 2015, he developed frequent painful body muscle jerking movement, we have telephone adjusted his medications, is now taking regular dose of clonazepam 1 mg 3 times a day, Ativan 1 mg every night, baclofen 20 medical twice a day, Depakote 500 mg every night, gabapentin 600 mg twice a day, oxycodone 30-10 mg every 3 hours, tizanidine 2 mg 2 tablets every 6 hours, his painful muscle spasm has much improved, he scheduled to have repeat IV ketamine infusion in June first for 10 days at [facility] again, his right upper extremity spasticity and pain has much improved with previous injection, most of pain is at  bilateral occipital, cervical, shoulder revision now  UPDATE Oct  2015: Last Botox injection in July 2015 has been helpful, but he has developed left neck weakness, lasting for one month, he is under a lot of stress, his wife is dealing with metastatic squamous cell carcinoma, he has changed to a new physician at [facility]  [facility], worry about the possibility of continued ketamine infusion, which has been very helpful for him.  UPDATE Jan 27th 2016: Last injection was in October 2015, which has been helpful, today he complains of intermittent right more than left upper extremity muscle spasm, right hand finger spasm, tenderness, bilateral neck, shoulder He also takes care of his wife, who has suffered metastatic cancer,  UPDATE Feb 22 2015: He is with  his wife at today's clinical visit, last EMG guided injection was in September 2016, heel has suffered GI symptoms over the past few weeks, intractable nausea, with mild improvement now, his wife has finished her treatment, is now back to work at [facility]  UPDATE May 11th 2017: He saw his pain management physician at [facility],will be admitted to [facility] for inpatient pain control, he had multiple recurrent severe back muscle spasm, is now taking narcotics, baclofen, Botox continued to help his pain, he received 600 units of Botox injection in January 2017, no significant side effect noticed.  UPDATE August 17th 2017:  [facility],he received to right upper extremity interscalene nerve block under ultrasound guidance, which fail to improve his symptoms, actually made his symptoms worse, he was not able to accomplish physical therapy as previously planned.  It is hard to tell result from previous Botox injection in Jun 10 2015  UPDATE Jan 12 2016: He responded very well to previous injection in August 2017, over the past few months, he had frequent kidney stone.  UPDATE April 13 2015: He responded very well to previous Botox injection in December 2017,  UPDATE August 30 2016: He responded very well to previous injection on April 13 2015, in recent few weeks he noticed increased right hand finger cramping, deep achy pain,  Update January 04 2017: He responded well to previous injection, suffered kidney stone, no complaints of worsening left shoulder blade pain,  Update April 10, 2017: He is seeing new pain management data [facility], who have suggested him to taper off clonazepam  UPDATE July 03 2017: He responded very well to previous injection, no significant side effect noticed, he also spend more time in the hot water tub, which has been helpful  UPDATE Sept 3 2019: He responded very well to previous Botox injection until a week ago, then he began  to have recurrent bilateral neck, shoulder pain.  UPDATE Jan 21 2018: He suffered kidney stone, with severe flank pain, also since early December 2019 he noticed numbness between his nipple line and knee level, but denied weakness, denies bowel and bladder incontinence, he has worsening gait abnormality due to recent fall, worsening right knee pain, previous Botox injection has helped him,  UPDATE April 30 2018: He responded very well to previous injection.  UPDATE July 31 2018: He responded very well to previous injection  UPDATE Nov 06 2018: He responded very well to previous injection.  REVIEW OF SYSTEMS: Full 14 system review of systems performed and notable only for as above  ALLERGIES: Allergies  Allergen Reactions  . Diclofenac Sodium     HOME MEDICATIONS: Current Outpatient Medications  Medication Sig Dispense Refill  . baclofen (LIORESAL) 20 MG tablet Take 20 mg by mouth 2 (two) times daily.    . OnabotulinumtoxinA (BOTOX IJ) Inject 600 Units as directed every 3 (three) months.     . ondansetron (ZOFRAN) 4 MG tablet TAKE 1 TABLET BY MOUTH  EVERY 8 HOURS AS NEEDED FOR NAUSEA OR VOMITING 60  tablet 3  . OxyCODONE HCl (ROXICODONE PO) Take by mouth as directed.    . pregabalin (LYRICA) 150 MG capsule Take 150 mg by mouth 3 (three) times daily.    . promethazine (PHENERGAN) 25 MG tablet Take by mouth.    . tamsulosin (FLOMAX) 0.4 MG CAPS capsule Take 0.4 mg by mouth.    . TESTOSTERONE IM Inject 400 Units into the muscle every 30 (thirty) days.    Marland Kitchen tiZANidine (ZANAFLEX) 2 MG tablet Take 2 tablets (4 mg total) by mouth every 6 (six) hours as needed for muscle spasms. 720 tablet 1  . XARELTO 20 MG TABS tablet      No current facility-administered medications for this visit.     PAST MEDICAL HISTORY: Past Medical History:  Diagnosis Date  . Complex regional pain syndrome 04/23/2012  . Dystonia   . Dystonia 04/23/2012  . Falls   . Insomnia w/ sleep apnea   . Other fragments  of torsion dystonia     PAST SURGICAL HISTORY: Past Surgical History:  Procedure Laterality Date  . KNEE SURGERY    . SHOULDER SURGERY      FAMILY HISTORY: Family History  Problem Relation Age of Onset  . Heart disease Mother   . Leukemia Father     SOCIAL HISTORY:  Social History   Socioeconomic History  . Marital status: Married    Spouse name: Not on file  . Number of children: 1  . Years of education: Not on file  . Highest education level: Not on file  Occupational History  . Not on file  Social Needs  . Financial resource strain: Not on file  . Food insecurity    Worry: Not on file    Inability: Not on file  . Transportation needs    Medical: Not on file    Non-medical: Not on file  Tobacco Use  . Smoking status: Former Games developer  . Smokeless tobacco: Never Used  . Tobacco comment: quit 18 years ago  Substance and Sexual Activity  . Alcohol use: No  . Drug use: No  . Sexual activity: Not on file  Lifestyle  . Physical activity    Days per week: Not on file    Minutes per session: Not on file  . Stress: Not on file  Relationships  . Social Musician on phone: Not on file    Gets together: Not on file    Attends religious service: Not on file    Active member of club or organization: Not on file    Attends meetings of clubs or organizations: Not on file    Relationship status: Not on file  . Intimate partner violence    Fear of current or ex partner: Not on file    Emotionally abused: Not on file    Physically abused: Not on file    Forced sexual activity: Not on file  Other Topics Concern  . Not on file  Social History Narrative   Lives ath home w/ his wife     PHYSICAL EXAM   Vitals:   11/06/18 1459  BP: 128/82  Pulse: 76  Temp: 98.1 F (36.7 C)  Weight: 253 lb 8 oz (115 kg)    Not recorded      Body mass index is 38.54 kg/m.   Regional complex pain syndrome Right upper extremity and cervical dystonia  He has neck  rotated to right, right tilt, left shoulder elevation, right shoulder in  anterior rotation, right elbow flexion and right hand MPI flexion, PPI and DPI extended position  He tends to lean towards his right side, mild dsytonic posture of his right leg.  Assessment and Plan:  EMG guided BOTOX A injection,  Used 600 units of Botox A   Right levator scapulae 25 units Right upper trapezius 25   Right splenius cervix 25 x4=100 units Right iliocostalis 25 units Right splenius capitis 75 units Right Rhomboid 50 units  Left levator scapulae 25 units Left upper trapezius 25  units Left rhomboid 50  Left iliocostalis 50  Left splenius capitus 25 x 3=75 units Left splenius cervix 25x 3=75 units    He will come back in  3 months for repeat injections.      Marcial Pacas, M.D. Ph.D.  [facility] [address] [place], [place] [address] Ph: [phone] Fax: [phone]  CC: Referring Provider

## 2019-01-06 ENCOUNTER — Telehealth: Payer: Self-pay | Admitting: Neurology

## 2019-01-06 NOTE — Telephone Encounter (Signed)
Unable to lvm (mailbox full) to r/s 1/13 appt due to MD being out

## 2019-01-13 ENCOUNTER — Telehealth: Payer: Self-pay | Admitting: Neurology

## 2019-01-13 NOTE — Telephone Encounter (Signed)
Patient does not have apt until Feb, we will call closer to time. DW

## 2019-01-13 NOTE — Telephone Encounter (Signed)
domonique with [facility] called to schedule delivery for patient botox. Please follow up

## 2019-02-12 ENCOUNTER — Ambulatory Visit: Payer: Managed Care, Other (non HMO) | Admitting: Neurology

## 2019-03-10 ENCOUNTER — Ambulatory Visit (INDEPENDENT_AMBULATORY_CARE_PROVIDER_SITE_OTHER): Payer: Managed Care, Other (non HMO) | Admitting: Neurology

## 2019-03-10 ENCOUNTER — Other Ambulatory Visit: Payer: Self-pay

## 2019-03-10 ENCOUNTER — Encounter: Payer: Self-pay | Admitting: Neurology

## 2019-03-10 VITALS — BP 147/77 | HR 82 | Temp 97.2°F | Ht 68.0 in | Wt 270.5 lb

## 2019-03-10 DIAGNOSIS — G243 Spasmodic torticollis: Secondary | ICD-10-CM | POA: Diagnosis not present

## 2019-03-10 DIAGNOSIS — G249 Dystonia, unspecified: Secondary | ICD-10-CM | POA: Diagnosis not present

## 2019-03-10 DIAGNOSIS — G5640 Causalgia of unspecified upper limb: Secondary | ICD-10-CM

## 2019-03-10 NOTE — Progress Notes (Signed)
**  Botox 100 units x 6 vials, NDC 0569-7948-01, Lot [id number], Exp 10/2021, specialty pharmacy.//[email or web address],[email or web address]**

## 2019-03-10 NOTE — Progress Notes (Signed)
PATIENT: Brandon Lin DOB: 1970/10/24  HISTORICAL  Brandon Lin, is a 49 year-old right-handed Caucasian male, has suffered complex regional pain syndrome following a right shoulder injury in 1998, he is here every 3 months for EMG guided Botox injection for his neck, shoulder pain, right upper extremity spasticity.  He fell off a deck and landed on his right neck and shoulder area in 1998, he had recurrent right shoulder pain, later he had multiple surgeries, each procedure making his symptoms worse, he fell off landed on his right shoulder again in 2005, he has developed intractable pain in his right shoulder and neck, dystonic postural, eventually was diagnosed with complex regional pain syndrome. From 2005 to 2007, he suffered severe neck pain, bilateral shoulder pain, abnormal posture of his neck, right upper extremity,to the point of difficult walking, fell many times.  He has been under the care of Dr. Tyron Russell since 2006, receiving Botox injections every 90 days, which does help his right neck and shoulder pain.  Also under pain management at [facility] anesthesiologist Dr. Ilona Sorrel, he had significant pain around early 2013, he began to receive inpatient ketamine infusion since March 2013, every 3 months, initially spreading over 10 days with stable daily osage, slowly infusion 4 hours each day, but in August 2014, the infusion was cut back to 5 days, he most recent infusion in January 2015, he only received 3 days infusion.  He is on polypharmacy treatment,  He is taking baclofen.,Neurontin, Ativan 1 mg 3 times a day,  clonazepam, tizanidine, OxyCodone,  dosage is adjusted following his most recent hospital admission for pain management at [facility] in December 2014, currently 30 plus 10 mg every 3 hours, He is also receiving IV ketamine treatment every 6 months at [facility]  His father died of leukemia at age 75, mother  is healthy. He is disabled due to his condition.  Married, has a son. 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    BOTOX has been very helpful, but he continues to have right neck, left neck and right  shoulder spastic pain, wearing off in 2 months.   He continues to get ketamine infusion from [facility],   There was some changes of his ketamine schedule, he got  3 days iv ketamine in Dec 2014 instead of previously scheduled 10 days, shortly after the injection, around January 2015, he developed frequent painful body muscle jerking movement, we have telephone adjusted his medications, is now taking regular dose of clonazepam 1 mg 3 times a day, Ativan 1 mg every night, baclofen 20 medical twice a day, Depakote 500 mg every night, gabapentin 600 mg twice a day, oxycodone 30-10 mg every 3 hours, tizanidine 2 mg 2 tablets every 6 hours, his painful muscle spasm has much improved, he scheduled to have repeat IV ketamine infusion in June first for 10 days at [facility] again, his right upper extremity spasticity and pain has much improved with previous injection, most of pain is at  bilateral occipital, cervical, shoulder revision now  UPDATE Oct  2015: Last Botox injection in July 2015 has been helpful, but he has developed left neck weakness, lasting for one month, he is under a lot of stress, his wife is dealing with metastatic squamous cell carcinoma, he has changed to a new physician at [name], worry about the possibility of continued ketamine infusion, which has been very helpful for him.  UPDATE Jan 27th 2016: Last injection was in October 2015, which has been helpful, today he complains of intermittent right more than left upper extremity muscle spasm, right hand finger spasm, tenderness, bilateral neck, shoulder He also takes care of his wife, who has suffered metastatic cancer,  UPDATE Feb 22 2015: He is with  his wife at today's clinical visit, last EMG guided injection was in September 2016, heel has suffered GI symptoms over the past few weeks, intractable nausea, with mild improvement now, his wife has finished her treatment, is now back to work at [employer]  UPDATE May 11th 2017: He saw his pain management physician at [place] [name],will be admitted to [facility] for inpatient pain control, he had multiple recurrent severe back muscle spasm, is now taking narcotics, baclofen, Botox continued to help his pain, he received 600 units of Botox injection in January 2017, no significant side effect noticed.  UPDATE August 17th 2017:  I reviewed [facility] visit in Jun 15 2015,he received to right upper extremity interscalene nerve block under ultrasound guidance, which fail to improve his symptoms, actually made his symptoms worse, he was not able to accomplish physical therapy as previously planned.  It is hard to tell result from previous Botox injection in Jun 10 2015  UPDATE Jan 12 2016: He responded very well to previous injection in August 2017, over the past few months, he had frequent kidney stone.  UPDATE April 13 2015: He responded very well to previous Botox injection in December 2017,  UPDATE August 30 2016: He responded very well to previous injection on April 13 2015, in recent few weeks he noticed increased right hand finger cramping, deep achy pain,  Update January 04 2017: He responded well to previous injection, suffered kidney stone, no complaints of worsening left shoulder blade pain,  Update April 10, 2017: He is seeing new pain management data [name], who have suggested him to taper off clonazepam  UPDATE July 03 2017: He responded very well to previous injection, no significant side effect noticed, he also spend more time in the hot water tub, which has been helpful  UPDATE Sept 3 2019: He responded very well to previous Botox injection until a week ago, then he began  to have recurrent bilateral neck, shoulder pain.  UPDATE Jan 21 2018: He suffered kidney stone, with severe flank pain, also since early December 2019 he noticed numbness between his nipple line and knee level, but denied weakness, denies bowel and bladder incontinence, he has worsening gait abnormality due to recent fall, worsening right knee pain, previous Botox injection has helped him,  UPDATE April 30 2018: He responded very well to previous injection.  UPDATE July 31 2018: He responded very well to previous injection  UPDATE Nov 06 2018: He responded very well to previous injection.  UPDATE Mar 10 2019: He did well to previous injection, used to Botox 600 units REVIEW OF SYSTEMS: Full 14 system review of systems performed and notable only for as above  ALLERGIES: Allergies  Allergen Reactions  . Diclofenac Sodium     HOME MEDICATIONS: Current Outpatient Medications  Medication Sig Dispense Refill  . baclofen (LIORESAL) 20 MG tablet Take 20 mg by mouth 2 (two) times daily.    . OnabotulinumtoxinA (BOTOX IJ) Inject 600 Units as directed every 3 (three) months.     . ondansetron (ZOFRAN) 4 MG tablet TAKE  1 TABLET BY MOUTH  EVERY 8 HOURS AS NEEDED FOR NAUSEA OR VOMITING 60 tablet 3  . OxyCODONE HCl (ROXICODONE PO) Take by mouth as directed.    . pregabalin (LYRICA) 150 MG capsule Take 150 mg by mouth 3 (three) times daily.    . promethazine (PHENERGAN) 25 MG tablet Take by mouth.    . tamsulosin (FLOMAX) 0.4 MG CAPS capsule Take 0.4 mg by mouth.    . TESTOSTERONE IM Inject 400 Units into the muscle every 30 (thirty) days.    Marland Kitchen tiZANidine (ZANAFLEX) 2 MG tablet Take 2 tablets (4 mg total) by mouth every 6 (six) hours as needed for muscle spasms. 720 tablet 1  . XARELTO 20 MG TABS tablet      No current facility-administered medications for this visit.    PAST MEDICAL HISTORY: Past Medical History:  Diagnosis Date  . Complex regional pain syndrome 04/23/2012  . Dystonia   .  Dystonia 04/23/2012  . Falls   . Insomnia w/ sleep apnea   . Other fragments of torsion dystonia     PAST SURGICAL HISTORY: Past Surgical History:  Procedure Laterality Date  . KNEE SURGERY    . SHOULDER SURGERY      FAMILY HISTORY: Family History  Problem Relation Age of Onset  . Heart disease Mother   . Leukemia Father     SOCIAL HISTORY:  Social History   Socioeconomic History  . Marital status: Married    Spouse name: Not on file  . Number of children: 1  . Years of education: Not on file  . Highest education level: Not on file  Occupational History  . Not on file  Tobacco Use  . Smoking status: Former Games developer  . Smokeless tobacco: Never Used  . Tobacco comment: quit 18 years ago  Substance and Sexual Activity  . Alcohol use: No  . Drug use: No  . Sexual activity: Not on file  Other Topics Concern  . Not on file  Social History Narrative   Lives ath home w/ his wife   Social Determinants of Health   Financial Resource Strain:   . Difficulty of Paying Living Expenses: Not on file  Food Insecurity:   . Worried About Programme researcher, broadcasting/film/video in the Last Year: Not on file  . Ran Out of Food in the Last Year: Not on file  Transportation Needs:   . Lack of Transportation (Medical): Not on file  . Lack of Transportation (Non-Medical): Not on file  Physical Activity:   . Days of Exercise per Week: Not on file  . Minutes of Exercise per Session: Not on file  Stress:   . Feeling of Stress : Not on file  Social Connections:   . Frequency of Communication with Friends and Family: Not on file  . Frequency of Social Gatherings with Friends and Family: Not on file  . Attends Religious Services: Not on file  . Active Member of Clubs or Organizations: Not on file  . Attends Banker Meetings: Not on file  . Marital Status: Not on file  Intimate Partner Violence:   . Fear of Current or Ex-Partner: Not on file  . Emotionally Abused: Not on file  .  Physically Abused: Not on file  . Sexually Abused: Not on file     PHYSICAL EXAM   Vitals:   03/10/19 1006  BP: (!) 147/77  Pulse: 82  Temp: (!) 97.2 F (36.2 C)  Weight: 270 lb 8  oz (122.7 kg)  Height: 5\' 8"  (1.727 m)    Not recorded      Body mass index is 41.13 kg/m.   Regional complex pain syndrome Right upper extremity and cervical dystonia  He has neck rotated to right, right tilt, left shoulder elevation, right shoulder in anterior rotation, right elbow flexion and right hand MPI flexion, PPI and DPI extended position  He tends to lean towards his right side, mild dsytonic posture of his right leg.  Assessment and Plan:  EMG guided BOTOX A injection,  Used 600 units of Botox A   Right levator scapulae 25 units Right upper trapezius 25  x2=50 Right splenius cervix 25 x4=100 units Right iliocostalis 25 units Right splenius capitis 75 units Right Rhomboid 25 units  Left levator scapulae 25 units Left upper trapezius 25x2=50  units Left rhomboid 25  Left iliocostalis 50  Left splenius capitus 25 x 3=75 units Left splenius cervix 25 units  Left palmaris longus 25 units Left flexor digitorum superficialis 25 units    He will come back in  3 months for repeat injections.      Marcial Pacas, M.D. Ph.D.  [facility] [address] [place], [place] [address] Ph: [phone] Fax: [phone]  CC: Referring Provider

## 2019-04-18 ENCOUNTER — Other Ambulatory Visit: Payer: Self-pay | Admitting: Neurology

## 2019-04-21 ENCOUNTER — Encounter: Payer: Self-pay | Admitting: *Deleted

## 2019-05-13 ENCOUNTER — Other Ambulatory Visit: Payer: Self-pay | Admitting: Neurology

## 2019-05-22 ENCOUNTER — Telehealth: Payer: Self-pay | Admitting: Neurology

## 2019-05-22 NOTE — Telephone Encounter (Signed)
Noted  

## 2019-05-22 NOTE — Telephone Encounter (Signed)
Rep with [facility] called to confirm if PA form was received for pts botox states they are needing it back to file with pts insurance.

## 2019-05-28 ENCOUNTER — Telehealth: Payer: Self-pay | Admitting: *Deleted

## 2019-05-28 NOTE — Telephone Encounter (Signed)
Patient has an appointment on Jun 11, 2019.  I called [name] [phone] and spoke to [facility].  He states that 64616, [phone] and [id number] are valid and billable.  [id number] will require PA. Call [phone] to get PA.   Ref# for call is [id number]. I called [phone] and spoke to Sue Lush who states the PA on file has expired.  She will fax a form over for me to fill out and fax back with cllinical notes.

## 2019-06-02 NOTE — Telephone Encounter (Signed)
PA received via fax. Botox was approved.  PA# is [id number] 600U every 3 months for 1 year 35521 included. Valid from 06/11/2019-06/10/2020.  I called [employer] back [phone] and spoke to [facility] because the CPT Code 72897 is not correct.  Told her it needs to be 64616 and 91504.  She was able to update the codes on the approval.  PA# remains the same.  I called [facility] [phone]  and spoke to [place] to scheduled delivery of medication. TBD 06/10/2019

## 2019-06-10 ENCOUNTER — Telehealth: Payer: Self-pay | Admitting: Neurology

## 2019-06-10 NOTE — Telephone Encounter (Signed)
Medication delivered 06/10/19 

## 2019-06-10 NOTE — Telephone Encounter (Signed)
Pt called wanting to confirm if he should cancel his apt states his son tested positive for covid.

## 2019-06-10 NOTE — Telephone Encounter (Signed)
I called pt. Since pt's son lives with the pt and tested positive for COVID today I recommended we delay pt's botox appt for at least 2 weeks. Pt is agreeable to this and his appt was rescheduled for 07/02/19 at 1:00pm. Pt verbalized understanding of new appt date and time.

## 2019-06-11 ENCOUNTER — Ambulatory Visit: Payer: Self-pay | Admitting: Neurology

## 2019-07-02 ENCOUNTER — Other Ambulatory Visit: Payer: Self-pay

## 2019-07-02 ENCOUNTER — Encounter: Payer: Self-pay | Admitting: Neurology

## 2019-07-02 ENCOUNTER — Ambulatory Visit (INDEPENDENT_AMBULATORY_CARE_PROVIDER_SITE_OTHER): Payer: Managed Care, Other (non HMO) | Admitting: Neurology

## 2019-07-02 VITALS — BP 124/67 | HR 72 | Ht 68.0 in | Wt 271.5 lb

## 2019-07-02 DIAGNOSIS — G5640 Causalgia of unspecified upper limb: Secondary | ICD-10-CM

## 2019-07-02 DIAGNOSIS — G243 Spasmodic torticollis: Secondary | ICD-10-CM

## 2019-07-02 NOTE — Progress Notes (Signed)
PATIENT: Brandon Lin DOB: 1970/10/24  HISTORICAL  Brandon Lin, is a 49 year-old right-handed Caucasian male, has suffered complex regional pain syndrome following a right shoulder injury in 1998, he is here every 3 months for EMG guided Botox injection for his neck, shoulder pain, right upper extremity spasticity.  He fell off a deck and landed on his right neck and shoulder area in 1998, he had recurrent right shoulder pain, later he had multiple surgeries, each procedure making his symptoms worse, he fell off landed on his right shoulder again in 2005, he has developed intractable pain in his right shoulder and neck, dystonic postural, eventually was diagnosed with complex regional pain syndrome. From 2005 to 2007, he suffered severe neck pain, bilateral shoulder pain, abnormal posture of his neck, right upper extremity,to the point of difficult walking, fell many times.  He has been under the care of Dr. Tyron Russell since 2006, receiving Botox injections every 90 days, which does help his right neck and shoulder pain.  Also under pain management at [facility] anesthesiologist Dr. Ilona Sorrel, he had significant pain around early 2013, he began to receive inpatient ketamine infusion since March 2013, every 3 months, initially spreading over 10 days with stable daily osage, slowly infusion 4 hours each day, but in August 2014, the infusion was cut back to 5 days, he most recent infusion in January 2015, he only received 3 days infusion.  He is on polypharmacy treatment,  He is taking baclofen.,Neurontin, Ativan 1 mg 3 times a day,  clonazepam, tizanidine, OxyCodone,  dosage is adjusted following his most recent hospital admission for pain management at [facility] in December 2014, currently 30 plus 10 mg every 3 hours, He is also receiving IV ketamine treatment every 6 months at [facility]  His father died of leukemia at age 75, mother  is healthy. He is disabled due to his condition.  Married, has a son. 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    BOTOX has been very helpful, but he continues to have right neck, left neck and right  shoulder spastic pain, wearing off in 2 months.   He continues to get ketamine infusion from [facility],   There was some changes of his ketamine schedule, he got  3 days iv ketamine in Dec 2014 instead of previously scheduled 10 days, shortly after the injection, around January 2015, he developed frequent painful body muscle jerking movement, we have telephone adjusted his medications, is now taking regular dose of clonazepam 1 mg 3 times a day, Ativan 1 mg every night, baclofen 20 medical twice a day, Depakote 500 mg every night, gabapentin 600 mg twice a day, oxycodone 30-10 mg every 3 hours, tizanidine 2 mg 2 tablets every 6 hours, his painful muscle spasm has much improved, he scheduled to have repeat IV ketamine infusion in June first for 10 days at [facility] again, his right upper extremity spasticity and pain has much improved with previous injection, most of pain is at  bilateral occipital, cervical, shoulder revision now  UPDATE Oct  2015: Last Botox injection in July 2015 has been helpful, but he has developed left neck weakness, lasting for one month, he is under a lot of stress, his wife is dealing with metastatic squamous cell carcinoma, he has changed to a new physician at [name], worry about the possibility of continued ketamine infusion, which has been very helpful for him.  UPDATE Jan 27th 2016: Last injection was in October 2015, which has been helpful, today he complains of intermittent right more than left upper extremity muscle spasm, right hand finger spasm, tenderness, bilateral neck, shoulder He also takes care of his wife, who has suffered metastatic cancer,  UPDATE Feb 22 2015: He is with  his wife at today's clinical visit, last EMG guided injection was in September 2016, heel has suffered GI symptoms over the past few weeks, intractable nausea, with mild improvement now, his wife has finished her treatment, is now back to work at [employer] May 11th 2017: He saw his pain management physician at [place] be admitted to [facility] for inpatient pain control, he had multiple recurrent severe back muscle spasm, is now taking narcotics, baclofen, Botox continued to help his pain, he received 600 units of Botox injection in January 2017, no significant side effect noticed.  UPDATE August 17th 2017:  I reviewed [facility] visit in Jun 15 2015,he received to right upper extremity interscalene nerve block under ultrasound guidance, which fail to improve his symptoms, actually made his symptoms worse, he was not able to accomplish physical therapy as previously planned.  It is hard to tell result from previous Botox injection in Jun 10 2015  UPDATE Jan 12 2016: He responded very well to previous injection in August 2017, over the past few months, he had frequent kidney stone.  UPDATE April 13 2015: He responded very well to previous Botox injection in December 2017,  UPDATE August 30 2016: He responded very well to previous injection on April 13 2015, in recent few weeks he noticed increased right hand finger cramping, deep achy pain,  Update January 04 2017: He responded well to previous injection, suffered kidney stone, no complaints of worsening left shoulder blade pain,  Update April 10, 2017: He is seeing new pain management data Gaspar Cola, who have suggested him to taper off clonazepam  UPDATE July 03 2017: He responded very well to previous injection, no significant side effect noticed, he also spend more time in the hot water tub, which has been helpful  UPDATE Sept 3 2019: He responded very well to previous Botox injection until a week ago, then he began  to have recurrent bilateral neck, shoulder pain.  UPDATE Jan 21 2018: He suffered kidney stone, with severe flank pain, also since early December 2019 he noticed numbness between his nipple line and knee level, but denied weakness, denies bowel and bladder incontinence, he has worsening gait abnormality due to recent fall, worsening right knee pain, previous Botox injection has helped him,  UPDATE April 30 2018: He responded very well to previous injection.  UPDATE July 31 2018: He responded very well to previous injection  UPDATE Nov 06 2018: He responded very well to previous injection.  UPDATE Mar 10 2019: He did well to previous injection, used to Botox 600 units  UPDATE July 02 2019: His neck, bilateral shoulder pain does respond to Botox injection  REVIEW OF SYSTEMS: Full 14 system review of systems performed and notable only for as above  ALLERGIES: Allergies  Allergen Reactions  . Diclofenac Sodium     HOME MEDICATIONS: Current Outpatient Medications  Medication Sig Dispense Refill  . baclofen (LIORESAL) 20 MG tablet Take 20 mg by mouth 2 (two) times daily.    Marland Kitchen BOTOX 100 units SOLR injection INJECT 600  UNITS INTO THE CERVICAL MUSCLES AND RIGHT UPPER EXTREMITIES BY PROVIDER EVERY 3 MONTHS. DISCARD UNUSED PORTION 6 each 3  . OnabotulinumtoxinA (BOTOX IJ) Inject 600 Units as directed every 3 (three) months.     . ondansetron (ZOFRAN) 4 MG tablet TAKE 1 TABLET BY MOUTH  EVERY 8 HOURS AS NEEDED FOR NAUSEA OR VOMITING 60 tablet 3  . OxyCODONE HCl (ROXICODONE PO) Take by mouth as directed.    . pregabalin (LYRICA) 150 MG capsule Take 150 mg by mouth 3 (three) times daily.    . tamsulosin (FLOMAX) 0.4 MG CAPS capsule Take 0.4 mg by mouth.    . TESTOSTERONE IM Inject 400 Units into the muscle every 30 (thirty) days.    Brandon Lin 20 MG TABS tablet      No current facility-administered medications for this visit.    PAST MEDICAL HISTORY: Past Medical History:  Diagnosis Date    . Complex regional pain syndrome 04/23/2012  . Dystonia   . Dystonia 04/23/2012  . Falls   . Insomnia w/ sleep apnea   . Other fragments of torsion dystonia     PAST SURGICAL HISTORY: Past Surgical History:  Procedure Laterality Date  . KNEE SURGERY    . SHOULDER SURGERY      FAMILY HISTORY: Family History  Problem Relation Age of Onset  . Heart disease Mother   . Leukemia Father     SOCIAL HISTORY:  Social History   Socioeconomic History  . Marital status: Married    Spouse name: Not on file  . Number of children: 1  . Years of education: Not on file  . Highest education level: Not on file  Occupational History  . Not on file  Tobacco Use  . Smoking status: Former Games developer  . Smokeless tobacco: Never Used  . Tobacco comment: quit 18 years ago  Substance and Sexual Activity  . Alcohol use: No  . Drug use: No  . Sexual activity: Not on file  Other Topics Concern  . Not on file  Social History Narrative   Lives ath home w/ his wife   Social Determinants of Health   Financial Resource Strain:   . Difficulty of Paying Living Expenses:   Food Insecurity:   . Worried About Programme researcher, broadcasting/film/video in the Last Year:   . Barista in the Last Year:   Transportation Needs:   . Freight forwarder (Medical):   Marland Kitchen Lack of Transportation (Non-Medical):   Physical Activity:   . Days of Exercise per Week:   . Minutes of Exercise per Session:   Stress:   . Feeling of Stress :   Social Connections:   . Frequency of Communication with Friends and Family:   . Frequency of Social Gatherings with Friends and Family:   . Attends Religious Services:   . Active Member of Clubs or Organizations:   . Attends Banker Meetings:   Marland Kitchen Marital Status:   Intimate Partner Violence:   . Fear of Current or Ex-Partner:   . Emotionally Abused:   Marland Kitchen Physically Abused:   . Sexually Abused:      PHYSICAL EXAM   Vitals:   07/02/19 1305  BP: 124/67  Pulse: 72   Weight: 271 lb 8 oz (123.2 kg)  Height: 5\' 8"  (1.727 m)    Not recorded      Body mass index is 41.28 kg/m.   Regional complex pain syndrome Right upper extremity and cervical dystonia  He has neck rotated to right, right tilt, left shoulder elevation, right shoulder in anterior rotation, right elbow flexion and right hand MPI flexion, PPI and DPI extended position  He tends to lean towards his right side, mild dsytonic posture of his right leg.  Assessment and Plan:  EMG guided BOTOX A injection,  Used 600 units of Botox A   Right levator scapulae 50 units Right upper trapezius 25  x4=100 Right splenius cervix 25 x2=50 units Right splenius capitis 50 units Right semispinalis 50 units Right iliocostalis 25x2= 50 units   Left levator scapulae 25 units Left upper trapezius 25x2=50  units Left semispinalis 50 units Left splenius capitus 25 x 3=75 units Left splenius cervix 25 unitsx= 50 units      He will come back in  3 months for repeat injections.      Levert Feinstein, M.D. Ph.D.  [facility] [address] [place], [place] [address] Ph: [phone] Fax: [phone]  CC: Referring Provider

## 2019-07-02 NOTE — Progress Notes (Signed)
**  Botox 100 units x 6 vials, NDC 1497-0263-78, Lot [id number], Exp 10/2021, specialty pharmacy.//mck,rn**

## 2019-10-08 ENCOUNTER — Ambulatory Visit (INDEPENDENT_AMBULATORY_CARE_PROVIDER_SITE_OTHER): Payer: Managed Care, Other (non HMO) | Admitting: Neurology

## 2019-10-08 ENCOUNTER — Encounter: Payer: Self-pay | Admitting: Neurology

## 2019-10-08 VITALS — Ht 68.0 in | Wt 263.5 lb

## 2019-10-08 DIAGNOSIS — G249 Dystonia, unspecified: Secondary | ICD-10-CM

## 2019-10-08 DIAGNOSIS — G5640 Causalgia of unspecified upper limb: Secondary | ICD-10-CM

## 2019-10-08 DIAGNOSIS — G243 Spasmodic torticollis: Secondary | ICD-10-CM

## 2019-10-08 NOTE — Progress Notes (Signed)
**  Botox 100 units x 6 vials, NDC 0301-3143-88, Lot [id number], Exp 11/2021, specialty pharmacy.//mck,rn**

## 2019-10-09 NOTE — Progress Notes (Signed)
PATIENT: Brandon Lin DOB: 1970/10/24  HISTORICAL  [facility], is a 49 year-old right-handed Caucasian male, has suffered complex regional pain syndrome following a right shoulder injury in 1998, he is here every 3 months for EMG guided Botox injection for his neck, shoulder pain, right upper extremity spasticity.  He fell off a deck and landed on his right neck and shoulder area in 1998, he had recurrent right shoulder pain, later he had multiple surgeries, each procedure making his symptoms worse, he fell off landed on his right shoulder again in 2005, he has developed intractable pain in his right shoulder and neck, dystonic postural, eventually was diagnosed with complex regional pain syndrome. From 2005 to 2007, he suffered severe neck pain, bilateral shoulder pain, abnormal posture of his neck, right upper extremity,to the point of difficult walking, fell many times.  He has been under the care of Dr. Tyron Lin since 2006, receiving Botox injections every 90 days, which does help his right neck and shoulder pain.  Also under pain management at [facility] anesthesiologist Dr. Ilona Lin, he had significant pain around early 2013, he began to receive inpatient ketamine infusion since March 2013, every 3 months, initially spreading over 10 days with stable daily osage, slowly infusion 4 Lin each day, but in August Lin, the infusion was cut back to 5 days, he most recent infusion in January 2015, he only received 3 days infusion.  He is on polypharmacy treatment,  He is taking baclofen.,Neurontin, Ativan 1 mg 3 times a day,  clonazepam, tizanidine, OxyCodone,  dosage is adjusted following his most recent Lin admission for pain management at [facility], He is also receiving IV ketamine treatment every 6 months at [facility]  His father died of leukemia at age 75, mother  is healthy. He is disabled due to his condition.  Married, has a son. 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    BOTOX has been very helpful, but he continues to have right neck, left neck and right  shoulder spastic pain, wearing off in 2 months.   He continues to get ketamine infusion from [facility], he got  3 days iv ketamine in Dec Lin instead of previously scheduled 10 days, shortly after the injection, around January 2015, he developed frequent painful body muscle jerking movement, we have telephone adjusted his medications, is now taking regular dose of clonazepam 1 mg 3 times a day, Ativan 1 mg every night, baclofen 20 medical twice a day, Depakote 500 mg every night, gabapentin 600 mg twice a day, oxycodone 30-10 mg every 3 Lin, tizanidine 2 mg 2 tablets every 6 Lin, his painful muscle spasm has much improved, he scheduled to have repeat IV ketamine infusion in June first for 10 days at [facility] again, his right upper extremity spasticity and pain has much improved with previous injection, most of pain is at  bilateral occipital, cervical, shoulder revision now  UPDATE Oct  2015: Last Botox injection in July 2015 has been helpful, but he has developed left neck weakness, lasting for one month, he is under a lot of stress, his wife is dealing with metastatic squamous cell carcinoma, he has changed to a new physician at [facility], worry about the possibility of continued ketamine infusion, which has been very helpful for him.  UPDATE Jan 27th 2016: Last injection was in October 2015, which has been helpful, today he complains of intermittent right more than left upper extremity muscle spasm, right hand finger spasm, tenderness, bilateral neck, shoulder He also takes care of his wife, who has suffered metastatic cancer,  UPDATE Feb 22 2015: He is with  his wife at today's clinical visit, last EMG guided injection was in September 2016, heel has suffered GI symptoms over the past few weeks, intractable nausea, with mild improvement now, his wife has finished her treatment, is now back to work at [facility]  UPDATE May 11th 2017: He saw his pain management physician at [facility],will be admitted to [facility] for inpatient pain control, he had multiple recurrent severe back muscle spasm, is now taking narcotics, baclofen, Botox continued to help his pain, he received 600 units of Botox injection in January 2017, no significant side effect noticed.  UPDATE August 17th 2017:  I reviewed [facility] visit in Jun 15 2015,he received to right upper extremity interscalene nerve block under ultrasound guidance, which fail to improve his symptoms, actually made his symptoms worse, he was not able to accomplish physical therapy as previously planned.  It is hard to tell result from previous Botox injection in Jun 10 2015  UPDATE Jan 12 2016: He responded very well to previous injection in August 2017, over the past few months, he had frequent kidney stone.  UPDATE April 13 2015: He responded very well to previous Botox injection in December 2017,  UPDATE August 30 2016: He responded very well to previous injection on April 13 2015, in recent few weeks he noticed increased right hand finger cramping, deep achy pain,  Update January 04 2017: He responded well to previous injection, suffered kidney stone, no complaints of worsening left shoulder blade pain,  Update April 10, 2017: He is seeing new pain management [facility], who have suggested him to taper off clonazepam  UPDATE July 03 2017: He responded very well to previous injection, no significant side effect noticed, he also spend more time in the hot water tub, which has been helpful  UPDATE Sept 3 2019: He responded very well to previous Botox injection until a week ago, then he began  to have recurrent bilateral neck, shoulder pain.  UPDATE Jan 21 2018: He suffered kidney stone, with severe flank pain, also since early December 2019 he noticed numbness between his nipple line and knee level, but denied weakness, denies bowel and bladder incontinence, he has worsening gait abnormality due to recent fall, worsening right knee pain, previous Botox injection has helped him,  UPDATE April 30 2018: He responded very well to previous injection.  UPDATE July 31 2018: He responded very well to previous injection  UPDATE Nov 06 2018: He responded very well to previous injection.  UPDATE Mar 10 2019: He did well to previous injection, used to Botox 600 units  UPDATE July 02 2019: His neck, bilateral shoulder pain does respond to Botox injection  Update October 08, 2019: He responded well to previous injection, still has frequent bilateral neck, shoulder, right anterior chest muscle spasm   REVIEW OF SYSTEMS: Full 14 Lin review of systems performed and notable only for as above  ALLERGIES: Allergies  Allergen Reactions  . Diclofenac Sodium     HOME MEDICATIONS: Current Outpatient Medications  Medication Sig Dispense Refill  . baclofen (LIORESAL)  20 MG tablet Take 20 mg by mouth 2 (two) times daily.    Marland Kitchen BOTOX 100 units SOLR injection INJECT 600 UNITS INTO THE CERVICAL MUSCLES AND RIGHT UPPER EXTREMITIES BY PROVIDER EVERY 3 MONTHS. DISCARD UNUSED PORTION 6 each 3  . OnabotulinumtoxinA (BOTOX IJ) Inject 600 Units as directed every 3 (three) months.     . ondansetron (ZOFRAN) 4 MG tablet TAKE 1 TABLET BY MOUTH  EVERY 8 Lin AS NEEDED FOR NAUSEA OR VOMITING 60 tablet 3  . OxyCODONE HCl (ROXICODONE PO) Take by mouth as directed.    . pregabalin (LYRICA) 150 MG capsule Take 150 mg by mouth 3 (three) times daily.    . tamsulosin (FLOMAX) 0.4 MG CAPS capsule Take 0.4 mg by mouth.    . TESTOSTERONE IM Inject 400 Units into the muscle every 30 (thirty) days.    Carlena Hurl 20  MG TABS tablet      No current facility-administered medications for this visit.    PAST MEDICAL HISTORY: Past Medical History:  Diagnosis Date  . Complex regional pain syndrome 3/25/Lin  . Dystonia   . Dystonia 3/25/Lin  . Falls   . Insomnia w/ sleep apnea   . Other fragments of torsion dystonia     PAST SURGICAL HISTORY: Past Surgical History:  Procedure Laterality Date  . KNEE SURGERY    . SHOULDER SURGERY      FAMILY HISTORY: Family History  Problem Relation Age of Onset  . Heart disease Mother   . Leukemia Father     SOCIAL HISTORY:  Social History   Socioeconomic History  . Marital status: Married    Spouse name: Not on file  . Number of children: 1  . Years of education: Not on file  . Highest education level: Not on file  Occupational History  . Not on file  Tobacco Use  . Smoking status: Former Games developer  . Smokeless tobacco: Never Used  . Tobacco comment: quit 18 years ago  Substance and Sexual Activity  . Alcohol use: No  . Drug use: No  . Sexual activity: Not on file  Other Topics Concern  . Not on file  Social History Narrative   Lives ath home w/ his wife   Social Determinants of Health   Financial Resource Strain:   . Difficulty of Paying Living Expenses: Not on file  Food Insecurity:   . Worried About Programme researcher, broadcasting/film/video in the Last Year: Not on file  . Ran Out of Food in the Last Year: Not on file  Transportation Needs:   . Lack of Transportation (Medical): Not on file  . Lack of Transportation (Non-Medical): Not on file  Physical Activity:   . Days of Exercise per Week: Not on file  . Minutes of Exercise per Session: Not on file  Stress:   . Feeling of Stress : Not on file  Social Connections:   . Frequency of Communication with Friends and Family: Not on file  . Frequency of Social Gatherings with Friends and Family: Not on file  . Attends Religious Services: Not on file  . Active Member of Clubs or Organizations: Not on file   . Attends Banker Meetings: Not on file  . Marital Status: Not on file  Intimate Partner Violence:   . Fear of Current or Ex-Partner: Not on file  . Emotionally Abused: Not on file  . Physically Abused: Not on file  . Sexually Abused: Not on file  PHYSICAL EXAM   Vitals:   10/08/19 1324  Weight: 263 lb 8 oz (119.5 kg)  Height: 5\' 8"  (1.727 m)   Not recorded     Body mass index is 40.07 kg/m.   Regional complex pain syndrome Right upper extremity and cervical dystonia  He has neck rotated to right, right tilt, left shoulder elevation, right shoulder in anterior rotation, right elbow flexion and right hand MPI flexion, PPI and DPI extended position  He tends to lean towards his right side, mild dsytonic posture of his right leg.  Assessment and Plan:  EMG guided BOTOX A injection,  Used 600 units of Botox A   Right levator scapulae 50 units Right upper trapezius 25  x4=100 Right splenius cervix 25 x2=50 units Right splenius capitis 50 units Right semispinalis 50 units Right iliocostalis 25x2= 50 units Right latissimus dorsi 50 units Right pectoralis major 50 units   Left levator scapulae 25 units Left upper trapezius 25 units Left semispinalis 50 units Left splenius capitus 25 Left splenius cervix 25 units      He will come back in  3 months for repeat injections.      , M.D. Ph.D.  [facility] [address] [place], [place] Ph: [phone] Fax: [phone]  CC: Referring Provider

## 2019-12-01 ENCOUNTER — Telehealth: Payer: Self-pay | Admitting: Neurology

## 2019-12-01 NOTE — Telephone Encounter (Signed)
Patient has a Botox appointment on 12/8. I received a fax from [facility] stating that Botox will be delivered to our office on 11/2. Patient has a PA on file with [place]. PA #[id number] (06/11/19- 06/10/20).

## 2020-01-07 ENCOUNTER — Ambulatory Visit: Payer: Managed Care, Other (non HMO) | Admitting: Neurology

## 2020-01-15 ENCOUNTER — Ambulatory Visit: Payer: Managed Care, Other (non HMO) | Admitting: Neurology

## 2020-02-20 ENCOUNTER — Other Ambulatory Visit: Payer: Self-pay | Admitting: Neurology

## 2020-02-23 ENCOUNTER — Encounter: Payer: Self-pay | Admitting: Neurology

## 2020-02-23 ENCOUNTER — Ambulatory Visit: Payer: Managed Care, Other (non HMO) | Admitting: Neurology

## 2020-02-23 ENCOUNTER — Other Ambulatory Visit: Payer: Self-pay

## 2020-02-23 VITALS — BP 152/85 | HR 79 | Ht 68.0 in | Wt 281.5 lb

## 2020-02-23 DIAGNOSIS — G249 Dystonia, unspecified: Secondary | ICD-10-CM

## 2020-02-23 DIAGNOSIS — G5640 Causalgia of unspecified upper limb: Secondary | ICD-10-CM

## 2020-02-23 DIAGNOSIS — G243 Spasmodic torticollis: Secondary | ICD-10-CM | POA: Diagnosis not present

## 2020-02-23 NOTE — Progress Notes (Unsigned)
**  Botox 100 units x 6 vials, NDC 1308-6578-46, Lot [id number], Exp 03/2022, specialty pharmacy.//mck,rn**

## 2020-02-25 NOTE — Progress Notes (Signed)
PATIENT: [facility] DOB: 1970/10/24  HISTORICAL  [name], is a 50 year-old right-handed Caucasian male, has suffered complex regional pain syndrome following a right shoulder injury in 1998, he is here every 3 months for EMG guided Botox injection for his neck, shoulder pain, right upper extremity spasticity.  He fell off a deck and landed on his right neck and shoulder area in 1998, he had recurrent right shoulder pain, later he had multiple surgeries, each procedure making his symptoms worse, he fell off landed on his right shoulder again in 2005, he has developed intractable pain in his right shoulder and neck, dystonic postural, eventually was diagnosed with complex regional pain syndrome. From 2005 to 2007, he suffered severe neck pain, bilateral shoulder pain, abnormal posture of his neck, right upper extremity,to the point of difficult walking, fell many times.  He has been under the care of Dr. Tyron Russell since 2006, receiving Botox injections every 90 days, which does help his right neck and shoulder pain.  Also under pain management at [facility] anesthesiologist Dr. Ilona Sorrel, he had significant pain around early 2013, he began to receive inpatient ketamine infusion since March 2013, every 3 months, initially spreading over 10 days with stable daily osage, slowly infusion 4 hours each day, but in August 2014, the infusion was cut back to 5 days, he most recent infusion in January 2015, he only received 3 days infusion.  He is on polypharmacy treatment,  He is taking baclofen.,Neurontin, Ativan 1 mg 3 times a day,  clonazepam, tizanidine, OxyCodone,  dosage is adjusted following his most recent hospital admission for pain management at [facility] in December 2014, currently 30 plus 10 mg every 3 hours, He is also receiving IV ketamine treatment every 6 months at [facility]  His father died of leukemia at age 75, mother  is healthy. He is disabled due to his condition.  Married, has a son. 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    BOTOX has been very helpful, but he continues to have right neck, left neck and right  shoulder spastic pain, wearing off in 2 months.   He continues to get ketamine infusion from [facility],   There was some changes of his ketamine schedule, he got  3 days iv ketamine in Dec 2014 instead of previously scheduled 10 days, shortly after the injection, around January 2015, he developed frequent painful body muscle jerking movement, we have telephone adjusted his medications, is now taking regular dose of clonazepam 1 mg 3 times a day, Ativan 1 mg every night, baclofen 20 medical twice a day, Depakote 500 mg every night, gabapentin 600 mg twice a day, oxycodone 30-10 mg every 3 hours, tizanidine 2 mg 2 tablets every 6 hours, his painful muscle spasm has much improved, he scheduled to have repeat IV ketamine infusion in June first for 10 days at [facility] again, his right upper extremity spasticity and pain has much improved with previous injection, most of pain is at  bilateral occipital, cervical, shoulder revision now  UPDATE Oct  2015: Last Botox injection in July 2015 has been helpful, but he has developed left neck weakness, lasting for one month, he is under a lot of stress, his wife is dealing with metastatic squamous cell carcinoma, he has changed to a new physician at [name], worry about the possibility of continued ketamine infusion, which has been very helpful for him.  UPDATE Jan 27th 2016: Last injection was in October 2015, which has been helpful, today he complains of intermittent right more than left upper extremity muscle spasm, right hand finger spasm, tenderness, bilateral neck, shoulder He also takes care of his wife, who has suffered metastatic cancer,  UPDATE Feb 22 2015: He is with  his wife at today's clinical visit, last EMG guided injection was in September 2016, heel has suffered GI symptoms over the past few weeks, intractable nausea, with mild improvement now, his wife has finished her treatment, is now back to work at [employer]  UPDATE May 11th 2017: He saw his pain management physician at [name],will be admitted to [facility] for inpatient pain control, he had multiple recurrent severe back muscle spasm, is now taking narcotics, baclofen, Botox continued to help his pain, he received 600 units of Botox injection in January 2017, no significant side effect noticed.  UPDATE August 17th 2017:  I reviewed [facility] visit in Jun 15 2015,he received to right upper extremity interscalene nerve block under ultrasound guidance, which fail to improve his symptoms, actually made his symptoms worse, he was not able to accomplish physical therapy as previously planned.  It is hard to tell result from previous Botox injection in Jun 10 2015  UPDATE Jan 12 2016: He responded very well to previous injection in August 2017, over the past few months, he had frequent kidney stone.  UPDATE April 13 2015: He responded very well to previous Botox injection in December 2017,  UPDATE August 30 2016: He responded very well to previous injection on April 13 2015, in recent few weeks he noticed increased right hand finger cramping, deep achy pain,  Update January 04 2017: He responded well to previous injection, suffered kidney stone, no complaints of worsening left shoulder blade pain,  Update April 10, 2017: He is seeing new pain management data [name], who have suggested him to taper off clonazepam  UPDATE July 03 2017: He responded very well to previous injection, no significant side effect noticed, he also spend more time in the hot water tub, which has been helpful  UPDATE Sept 3 2019: He responded very well to previous Botox injection until a week ago, then he began  to have recurrent bilateral neck, shoulder pain.  UPDATE Jan 21 2018: He suffered kidney stone, with severe flank pain, also since early December 2019 he noticed numbness between his nipple line and knee level, but denied weakness, denies bowel and bladder incontinence, he has worsening gait abnormality due to recent fall, worsening right knee pain, previous Botox injection has helped him,  UPDATE April 30 2018: He responded very well to previous injection.  UPDATE July 31 2018: He responded very well to previous injection  UPDATE Nov 06 2018: He responded very well to previous injection.  UPDATE Mar 10 2019: He did well to previous injection, used to Botox 600 units  UPDATE July 02 2019: His neck, bilateral shoulder pain does respond to Botox injection  Update October 08, 2019: He responded well to previous injection, still has frequent bilateral neck, shoulder, right anterior chest muscle spasm  Update February 23, 2020: He responded well to previous injection, but with recent weather change, he noticed increased pain at right shoulder, neck region  REVIEW OF SYSTEMS: Full 14 system review of systems performed and notable only for as above  ALLERGIES: Allergies  Allergen Reactions  . Diclofenac Sodium     HOME MEDICATIONS: Current Outpatient Medications  Medication Sig Dispense Refill  . baclofen (LIORESAL) 20 MG tablet Take 20 mg by mouth 2 (two) times daily.    Marland Kitchen BOTOX 100 units SOLR injection INJECT 600 UNITS INTO THE CERVICAL MUSCLES AND RIGHT UPPER EXTREMITIES BY PROVIDER EVERY 3 MONTHS. DISCARD UNUSED PORTION 6 each 3  . ondansetron (ZOFRAN) 4 MG tablet TAKE 1 TABLET BY MOUTH  EVERY 8 HOURS AS NEEDED FOR NAUSEA OR VOMITING 60 tablet 3  . OxyCODONE HCl (ROXICODONE PO) Take by mouth as directed.    . pregabalin (LYRICA) 150 MG capsule Take 150 mg by mouth 3 (three) times daily.    . tamsulosin (FLOMAX) 0.4 MG CAPS capsule Take 0.4 mg by mouth.    . TESTOSTERONE IM Inject  400 Units into the muscle every 30 (thirty) days.    Carlena Hurl 20 MG TABS tablet      No current facility-administered medications for this visit.    PAST MEDICAL HISTORY: Past Medical History:  Diagnosis Date  . Complex regional pain syndrome 04/23/2012  . Dystonia   . Dystonia 04/23/2012  . Falls   . Insomnia w/ sleep apnea   . Other fragments of torsion dystonia     PAST SURGICAL HISTORY: Past Surgical History:  Procedure Laterality Date  . KNEE SURGERY    . SHOULDER SURGERY      FAMILY HISTORY: Family History  Problem Relation Age of Onset  . Heart disease Mother   . Leukemia Father     SOCIAL HISTORY:  Social History   Socioeconomic History  . Marital status: Married    Spouse name: Not on file  . Number of children: 1  . Years of education: Not on file  . Highest education level: Not on file  Occupational History  . Not on file  Tobacco Use  . Smoking status: Former Games developer  . Smokeless tobacco: Never Used  . Tobacco comment: quit 18 years ago  Substance and Sexual Activity  . Alcohol use: No  . Drug use: No  . Sexual activity: Not on file  Other Topics Concern  . Not on file  Social History Narrative   Lives ath home w/ his wife   Social Determinants of Health   Financial Resource Strain: Not on file  Food Insecurity: Not on file  Transportation Needs: Not on file  Physical Activity: Not on file  Stress: Not on file  Social Connections: Not on file  Intimate Partner Violence: Not on file     PHYSICAL EXAM   Vitals:   02/23/20 1312  BP: (!) 152/85  Pulse: 79  Weight: 281 lb 8 oz (127.7 kg)  Height: 5\' 8"  (1.727 m)   Not recorded     Body mass index is 42.8 kg/m.   Regional complex pain syndrome Right upper extremity and cervical dystonia  He has neck rotated to right, right tilt, left shoulder elevation, right shoulder in anterior rotation, right elbow flexion and right hand MPI flexion, PPI and DPI extended position  He  tends to lean towards his right side, mild dsytonic posture of his right leg.  Assessment and Plan:  EMG guided BOTOX A injection,  Used 600 units of Botox A   Right levator scapulae 50 units Right upper trapezius 25  x4=100 Right splenius cervix 25 x2=50 units Right splenius capitis 50 units Right semispinalis 50 units Right iliocostalis 25x2= 50 units Right latissimus dorsi  50 units Right rhomboid 50 units   Left levator scapulae 25 units Left upper trapezius 25 units Left semispinalis 50 units Left splenius capitus 25 Left splenius cervix 25 units      He will come back in  3 months for repeat injections.      Levert Feinstein, M.D. Ph.D.  [facility] [address], [address] [place], [place] [address] Ph: [phone] Fax: [phone]  CC: Referring Provider

## 2020-03-03 ENCOUNTER — Telehealth: Payer: Self-pay | Admitting: Neurology

## 2020-03-03 NOTE — Telephone Encounter (Signed)
Patient's next Botox appointment is 4/27. I received (6) 100U vials of Botox today from [facility].

## 2020-03-12 DIAGNOSIS — Z0289 Encounter for other administrative examinations: Secondary | ICD-10-CM | POA: Insufficient documentation

## 2020-04-14 ENCOUNTER — Ambulatory Visit: Payer: Managed Care, Other (non HMO) | Admitting: Neurology

## 2020-05-09 ENCOUNTER — Other Ambulatory Visit: Payer: Self-pay | Admitting: Neurology

## 2020-05-26 ENCOUNTER — Ambulatory Visit (INDEPENDENT_AMBULATORY_CARE_PROVIDER_SITE_OTHER): Payer: Managed Care, Other (non HMO) | Admitting: Neurology

## 2020-05-26 ENCOUNTER — Encounter: Payer: Self-pay | Admitting: Neurology

## 2020-05-26 ENCOUNTER — Telehealth: Payer: Self-pay | Admitting: Neurology

## 2020-05-26 VITALS — BP 138/72 | HR 90 | Ht 68.0 in | Wt 278.0 lb

## 2020-05-26 DIAGNOSIS — G243 Spasmodic torticollis: Secondary | ICD-10-CM | POA: Diagnosis not present

## 2020-05-26 DIAGNOSIS — G249 Dystonia, unspecified: Secondary | ICD-10-CM

## 2020-05-26 DIAGNOSIS — G5640 Causalgia of unspecified upper limb: Secondary | ICD-10-CM

## 2020-05-26 NOTE — Progress Notes (Signed)
PATIENT: Brandon Lin DOB: 1970/10/24  HISTORICAL  Brandon Lin, is a 50 year-old right-handed Caucasian male, has suffered complex regional pain syndrome following a right shoulder injury in 1998, he is here every 3 months for EMG guided Botox injection for his neck, shoulder pain, right upper extremity spasticity.  He fell off a deck and landed on his right neck and shoulder area in 1998, he had recurrent right shoulder pain, later he had multiple surgeries, each procedure making his symptoms worse, he fell off landed on his right shoulder again in 2005, he has developed intractable pain in his right shoulder and neck, dystonic postural, eventually was diagnosed with complex regional pain syndrome. From 2005 to 2007, he suffered severe neck pain, bilateral shoulder pain, abnormal posture of his neck, right upper extremity,to the point of difficult walking, fell many times.  He has been under the care of Dr. Tyron Russell since 2006, receiving Botox injections every 90 days, which does help his right neck and shoulder pain.  Also under pain management at [facility] anesthesiologist Dr. Ilona Sorrel, he had significant pain around early 2013, he began to receive inpatient ketamine infusion since March 2013, every 3 months, initially spreading over 10 days with stable daily osage, slowly infusion 4 hours each day, but in August 2014, the infusion was cut back to 5 days, he most recent infusion in January 2015, he only received 3 days infusion.  He is on polypharmacy treatment,  He is taking baclofen.,Neurontin, Ativan 1 mg 3 times a day,  clonazepam, tizanidine, OxyCodone,  dosage is adjusted following his most recent hospital admission for pain management at [facility] in December 2014, currently 30 plus 10 mg every 3 hours, He is also receiving IV ketamine treatment every 6 months at [facility]  His father died of leukemia at age 75, mother  is healthy. He is disabled due to his condition.  Married, has a son. 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    BOTOX has been very helpful, but he continues to have right neck, left neck and right  shoulder spastic pain, wearing off in 2 months.   He continues to get ketamine infusion from [facility],   There was some changes of his ketamine schedule, he got  3 days iv ketamine in Dec 2014 instead of previously scheduled 10 days, shortly after the injection, around January 2015, he developed frequent painful body muscle jerking movement, we have telephone adjusted his medications, is now taking regular dose of clonazepam 1 mg 3 times a day, Ativan 1 mg every night, baclofen 20 medical twice a day, Depakote 500 mg every night, gabapentin 600 mg twice a day, oxycodone 30-10 mg every 3 hours, tizanidine 2 mg 2 tablets every 6 hours, his painful muscle spasm has much improved, he scheduled to have repeat IV ketamine infusion in June first for 10 days at [facility] again, his right upper extremity spasticity and pain has much improved with previous injection, most of pain is at  bilateral occipital, cervical, shoulder revision now  UPDATE Oct  2015: Last Botox injection in July 2015 has been helpful, but he has developed left neck weakness, lasting for one month, he is under a lot of stress, his wife is dealing with metastatic squamous cell carcinoma, he has changed to a new physician at [name], worry about the possibility of continued ketamine infusion, which has been very helpful for him.  UPDATE Jan 27th 2016: Last injection was in October 2015, which has been helpful, today he complains of intermittent right more than left upper extremity muscle spasm, right hand finger spasm, tenderness, bilateral neck, shoulder He also takes care of his wife, who has suffered metastatic cancer,  UPDATE Feb 22 2015: He is with  his wife at today's clinical visit, last EMG guided injection was in September 2016, heel has suffered GI symptoms over the past few weeks, intractable nausea, with mild improvement now, his wife has finished her treatment, is now back to work at [employer]  UPDATE May 11th 2017: He saw his pain management physician at [name],will be admitted to [facility] for inpatient pain control, he had multiple recurrent severe back muscle spasm, is now taking narcotics, baclofen, Botox continued to help his pain, he received 600 units of Botox injection in January 2017, no significant side effect noticed.  UPDATE August 17th 2017:  I reviewed [facility] visit in Jun 15 2015,he received to right upper extremity interscalene nerve block under ultrasound guidance, which fail to improve his symptoms, actually made his symptoms worse, he was not able to accomplish physical therapy as previously planned.  It is hard to tell result from previous Botox injection in Jun 10 2015  UPDATE Jan 12 2016: He responded very well to previous injection in August 2017, over the past few months, he had frequent kidney stone.  UPDATE April 13 2015: He responded very well to previous Botox injection in December 2017,  UPDATE August 30 2016: He responded very well to previous injection on April 13 2015, in recent few weeks he noticed increased right hand finger cramping, deep achy pain,  Update January 04 2017: He responded well to previous injection, suffered kidney stone, no complaints of worsening left shoulder blade pain,  Update April 10, 2017: He is seeing new pain management data Brandon Lin, who have suggested him to taper off clonazepam  UPDATE July 03 2017: He responded very well to previous injection, no significant side effect noticed, he also spend more time in the hot water tub, which has been helpful  UPDATE Sept 3 2019: He responded very well to previous Botox injection until a week ago, then he began  to have recurrent bilateral neck, shoulder pain.  UPDATE Jan 21 2018: He suffered kidney stone, with severe flank pain, also since early December 2019 he noticed numbness between his nipple line and knee level, but denied weakness, denies bowel and bladder incontinence, he has worsening gait abnormality due to recent fall, worsening right knee pain, previous Botox injection has helped him,  UPDATE April 30 2018: He responded very well to previous injection.  UPDATE July 31 2018: He responded very well to previous injection  UPDATE Nov 06 2018: He responded very well to previous injection.  UPDATE Mar 10 2019: He did well to previous injection, used to Botox 600 units  UPDATE July 02 2019: His neck, bilateral shoulder pain does respond to Botox injection  Update October 08, 2019: He responded well to previous injection, still has frequent bilateral neck, shoulder, right anterior chest muscle spasm  Update February 23, 2020: He responded well to previous injection, but with recent weather change, he noticed increased pain at right shoulder, neck region  REVIEW OF SYSTEMS: Full 14 system review of systems performed and notable only for as above  ALLERGIES: Allergies  Allergen Reactions  . Diclofenac Sodium     HOME MEDICATIONS: Current Outpatient Medications  Medication Sig Dispense Refill  . baclofen (LIORESAL) 20 MG tablet Take 20 mg by mouth 2 (two) times daily.    Marland Kitchen BOTOX 100 units SOLR injection INJECT 600 UNITS INTO THE CERVICAL MUSCLES AND RIGHT UPPER EXTREMITIES BY PROVIDER EVERY 3 MONTHS. DISCARD UNUSED PORTION. 6 each 3  . ondansetron (ZOFRAN) 4 MG tablet TAKE 1 TABLET BY MOUTH  EVERY 8 HOURS AS NEEDED FOR NAUSEA OR VOMITING 60 tablet 3  . OxyCODONE HCl (ROXICODONE PO) Take by mouth as directed.    . pregabalin (LYRICA) 150 MG capsule Take 150 mg by mouth 3 (three) times daily.    . tamsulosin (FLOMAX) 0.4 MG CAPS capsule Take 0.4 mg by mouth.    Carlena Hurl 20 MG TABS  tablet      No current facility-administered medications for this visit.    PAST MEDICAL HISTORY: Past Medical History:  Diagnosis Date  . Complex regional pain syndrome 04/23/2012  . Dystonia   . Dystonia 04/23/2012  . Falls   . Insomnia w/ sleep apnea   . Other fragments of torsion dystonia     PAST SURGICAL HISTORY: Past Surgical History:  Procedure Laterality Date  . KNEE SURGERY    . SHOULDER SURGERY      FAMILY HISTORY: Family History  Problem Relation Age of Onset  . Heart disease Mother   . Leukemia Father     SOCIAL HISTORY:  Social History   Socioeconomic History  . Marital status: Married    Spouse name: Not on file  . Number of children: 1  . Years of education: Not on file  . Highest education level: Not on file  Occupational History  . Not on file  Tobacco Use  . Smoking status: Former Games developer  . Smokeless tobacco: Never Used  . Tobacco comment: quit 18 years ago  Substance and Sexual Activity  . Alcohol use: No  . Drug use: No  . Sexual activity: Not on file  Other Topics Concern  . Not on file  Social History Narrative   Lives ath home w/ his wife   Social Determinants of Health   Financial Resource Strain: Not on file  Food Insecurity: Not on file  Transportation Needs: Not on file  Physical Activity: Not on file  Stress: Not on file  Social Connections: Not on file  Intimate Partner Violence: Not on file     PHYSICAL EXAM   Vitals:   05/26/20 1313  BP: 138/72  Pulse: 90  Weight: 278 lb (126.1 kg)  Height: 5\' 8"  (1.727 m)   Not recorded     Body mass index is 42.27 kg/m.   Regional complex pain syndrome Right upper extremity and cervical dystonia  He has neck rotated to right, right tilt, right shoulder elevation, right shoulder in anterior rotation, right elbow flexion and right hand MPI flexion, PPI and DPI extended position  He tends to lean towards his right side, mild dsytonic posture of his right  leg.  Assessment and Plan:  EMG guided BOTOX A injection,  Used 600 units of Botox A   Right levator scapulae 50 units Right splenius cervix 25 x2=50 units Right splenius capitis 50 units Right semispinalis 50 units Right iliocostalis 25x2= 50 units Right upper trapezius 50 units  Right pectoralis major 100 units  Right palmaris longus 50 units Right flexor digitorum superficialis 25 units Right flexor digitorum profundus 25  units    Left levator scapulae 25 units Left upper trapezius 25 units Left iliocostalis 25 units Left splenius cervix 25 units       He will come back in  3 months for repeat injections.      Levert Feinstein, M.D. Ph.D.  [facility] [address] [place], [place] [address] Ph: [phone] Fax: [phone]  CC: Referring Provider

## 2020-05-26 NOTE — Progress Notes (Signed)
**  Botox 100 units x 6, NDC 7915-0413-64, Lot [id number], Exp 03/2022, specialty pharmacy.//mck,rn**

## 2020-05-26 NOTE — Telephone Encounter (Signed)
Patient has a Botox appointment today. Botox shipment arrived in 03/2020 for today's injection. I scheduled his next injection for 7/27. Today I received (6) 100 unit vials of Botox from [facility] for that appointment.

## 2020-05-31 NOTE — Telephone Encounter (Signed)
Received call from [facility] with [facility] stating that a new PA with [name] is needed. His current PA for Botox will expire 5/12. Completed [employer] PA form and will give to MD to sign.

## 2020-06-01 NOTE — Telephone Encounter (Signed)
I faxed signed PA form to [place] at [phone].

## 2020-06-03 NOTE — Telephone Encounter (Signed)
Received approval from [place]. PA #[id number] (06/08/20- 06/08/21). I called [facility] and spoke with Marchelle Folks to provide this information for patient's account.

## 2020-08-25 ENCOUNTER — Ambulatory Visit (INDEPENDENT_AMBULATORY_CARE_PROVIDER_SITE_OTHER): Payer: Managed Care, Other (non HMO) | Admitting: Neurology

## 2020-08-25 ENCOUNTER — Other Ambulatory Visit: Payer: Self-pay

## 2020-08-25 ENCOUNTER — Encounter: Payer: Self-pay | Admitting: Neurology

## 2020-08-25 VITALS — BP 142/80 | HR 70 | Ht 68.0 in | Wt 295.0 lb

## 2020-08-25 DIAGNOSIS — G243 Spasmodic torticollis: Secondary | ICD-10-CM | POA: Diagnosis not present

## 2020-08-25 DIAGNOSIS — G5640 Causalgia of unspecified upper limb: Secondary | ICD-10-CM

## 2020-08-25 NOTE — Progress Notes (Signed)
**  Botox 100 units x 6 vials, NDC 9702-6378-58, Lot [id number], Exp 07/2022, specialty pharmacy.//mck,rn**

## 2020-08-25 NOTE — Progress Notes (Signed)
PATIENT: Brandon Lin DOB: 1970/10/24  HISTORICAL  [name], is a 50 year-old right-handed Caucasian male, has suffered complex regional pain syndrome following a right shoulder injury in 1998, he is here every 3 months for EMG guided Botox injection for his neck, shoulder pain, right upper extremity spasticity.  He fell off a deck and landed on his right neck and shoulder area in 1998, he had recurrent right shoulder pain, later he had multiple surgeries, each procedure making his symptoms worse, he fell off landed on his right shoulder again in 2005, he has developed intractable pain in his right shoulder and neck, dystonic postural, eventually was diagnosed with complex regional pain syndrome. From 2005 to 2007, he suffered severe neck pain, bilateral shoulder pain, abnormal posture of his neck, right upper extremity,to the point of difficult walking, fell many times.  He has been under the care of Dr. Tyron Russell since 2006, receiving Botox injections every 90 days, which does help his right neck and shoulder pain.  Also under pain management at [facility] anesthesiologist Dr. Ilona Sorrel, he had significant pain around early 2013, he began to receive inpatient ketamine infusion since March 2013, every 3 months, initially spreading over 10 days with stable daily osage, slowly infusion 4 hours each day, but in August 2014, the infusion was cut back to 5 days, he most recent infusion in January 2015, he only received 3 days infusion.  He is on polypharmacy treatment,  He is taking baclofen.,Neurontin, Ativan 1 mg 3 times a day,  clonazepam, tizanidine, OxyCodone,  dosage is adjusted following his most recent hospital admission for pain management at [facility] in December 2014, currently 30 plus 10 mg every 3 hours, He is also receiving IV ketamine treatment every 6 months at [facility]  His father died of leukemia at age 75, mother  is healthy. He is disabled due to his condition.  Married, has a son. 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    BOTOX has been very helpful, but he continues to have right neck, left neck and right  shoulder spastic pain, wearing off in 2 months.   He continues to get ketamine infusion from [facility],   There was some changes of his ketamine schedule, he got  3 days iv ketamine in Dec 2014 instead of previously scheduled 10 days, shortly after the injection, around January 2015, he developed frequent painful body muscle jerking movement, we have telephone adjusted his medications, is now taking regular dose of clonazepam 1 mg 3 times a day, Ativan 1 mg every night, baclofen 20 medical twice a day, Depakote 500 mg every night, gabapentin 600 mg twice a day, oxycodone 30-10 mg every 3 hours, tizanidine 2 mg 2 tablets every 6 hours, his painful muscle spasm has much improved, he scheduled to have repeat IV ketamine infusion in June first for 10 days at [facility] again, his right upper extremity spasticity and pain has much improved with previous injection, most of pain is at  bilateral occipital, cervical, shoulder revision now  UPDATE Oct  2015: Last Botox injection in July 2015 has been helpful, but he has developed left neck weakness, lasting for one month, he is under a lot of stress, his wife is dealing with metastatic squamous cell carcinoma, he has changed to a new physician at [name], worry about the possibility of continued ketamine infusion, which has been very helpful for him.  UPDATE Jan 27th 2016: Last injection was in October 2015, which has been helpful, today he complains of intermittent right more than left upper extremity muscle spasm, right hand finger spasm, tenderness, bilateral neck, shoulder He also takes care of his wife, who has suffered metastatic cancer,  UPDATE Feb 22 2015: He is with  his wife at today's clinical visit, last EMG guided injection was in September 2016, heel has suffered GI symptoms over the past few weeks, intractable nausea, with mild improvement now, his wife has finished her treatment, is now back to work at [employer]  UPDATE May 11th 2017: He saw his pain management physician at [name],will be admitted to [facility] for inpatient pain control, he had multiple recurrent severe back muscle spasm, is now taking narcotics, baclofen, Botox continued to help his pain, he received 600 units of Botox injection in January 2017, no significant side effect noticed.  UPDATE August 17th 2017:  I reviewed [facility] visit in Jun 15 2015,he received to right upper extremity interscalene nerve block under ultrasound guidance, which fail to improve his symptoms, actually made his symptoms worse, he was not able to accomplish physical therapy as previously planned.  It is hard to tell result from previous Botox injection in Jun 10 2015  UPDATE Jan 12 2016: He responded very well to previous injection in August 2017, over the past few months, he had frequent kidney stone.  UPDATE April 13 2015: He responded very well to previous Botox injection in December 2017,  UPDATE August 30 2016: He responded very well to previous injection on April 13 2015, in recent few weeks he noticed increased right hand finger cramping, deep achy pain,  Update January 04 2017: He responded well to previous injection, suffered kidney stone, no complaints of worsening left shoulder blade pain,  Update April 10, 2017: He is seeing new pain management data [name], who have suggested him to taper off clonazepam  UPDATE July 03 2017: He responded very well to previous injection, no significant side effect noticed, he also spend more time in the hot water tub, which has been helpful  UPDATE Sept 3 2019: He responded very well to previous Botox injection until a week ago, then he began  to have recurrent bilateral neck, shoulder pain.  UPDATE Jan 21 2018: He suffered kidney stone, with severe flank pain, also since early December 2019 he noticed numbness between his nipple line and knee level, but denied weakness, denies bowel and bladder incontinence, he has worsening gait abnormality due to recent fall, worsening right knee pain, previous Botox injection has helped him,  UPDATE April 30 2018: He responded very well to previous injection.  UPDATE July 31 2018: He responded very well to previous injection  UPDATE Nov 06 2018: He responded very well to previous injection.  UPDATE Mar 10 2019: He did well to previous injection, used to Botox 600 units  UPDATE July 02 2019: His neck, bilateral shoulder pain does respond to Botox injection  Update October 08, 2019: He responded well to previous injection, still has frequent bilateral neck, shoulder, right anterior chest muscle spasm  Update February 23, 2020: He responded well to previous injection, but with recent weather change, he noticed increased pain at right shoulder, neck region  UPDATE August 25 2020: He responded very well to previous injection, especially benefit from right arm injection,  REVIEW OF  SYSTEMS: Full 14 system review of systems performed and notable only for as above  ALLERGIES: Allergies  Allergen Reactions   Diclofenac Sodium     HOME MEDICATIONS: Current Outpatient Medications  Medication Sig Dispense Refill   baclofen (LIORESAL) 20 MG tablet Take 20 mg by mouth 2 (two) times daily.     BOTOX 100 units SOLR injection INJECT 600 UNITS INTO THE CERVICAL MUSCLES AND RIGHT UPPER EXTREMITIES BY PROVIDER EVERY 3 MONTHS. DISCARD UNUSED PORTION. 6 each 3   ondansetron (ZOFRAN) 4 MG tablet TAKE 1 TABLET BY MOUTH  EVERY 8 HOURS AS NEEDED FOR NAUSEA OR VOMITING 60 tablet 3   OxyCODONE HCl (ROXICODONE PO) Take by mouth as directed.     pregabalin (LYRICA) 150 MG capsule Take 150 mg by mouth 3 (three)  times daily.     tamsulosin (FLOMAX) 0.4 MG CAPS capsule Take 0.4 mg by mouth.     XARELTO 20 MG TABS tablet      No current facility-administered medications for this visit.    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   Falls    Insomnia w/ sleep apnea    Other fragments of torsion dystonia     PAST SURGICAL HISTORY: Past Surgical History:  Procedure Laterality Date   KNEE SURGERY     SHOULDER SURGERY      FAMILY HISTORY: Family History  Problem Relation Age of Onset   Heart disease Mother    Leukemia Father     SOCIAL HISTORY:  Social History   Socioeconomic History   Marital status: Married    Spouse name: Not on file   Number of children: 1   Years of education: Not on file   Highest education level: Not on file  Occupational History   Not on file  Tobacco Use   Smoking status: Former   Smokeless tobacco: Never   Tobacco comments:    quit 18 years ago  Substance and Sexual Activity   Alcohol use: No   Drug use: No   Sexual activity: Not on file  Other Topics Concern   Not on file  Social History Narrative   Lives ath home w/ his wife   Social Determinants of Health   Financial Resource Strain: Not on file  Food Insecurity: Not on file  Transportation Needs: Not on file  Physical Activity: Not on file  Stress: Not on file  Social Connections: Not on file  Intimate Partner Violence: Not on file     PHYSICAL EXAM   Vitals:   08/25/20 1305  BP: (!) 142/80  Pulse: 70  Weight: 295 lb (133.8 kg)  Height: 5\' 8"  (1.727 m)   Not recorded     Body mass index is 44.85 kg/m.   Regional complex pain syndrome Right upper extremity and cervical dystonia  He has neck rotated to right, right tilt, right shoulder elevation, right shoulder in anterior rotation, right elbow flexion and right hand MPI flexion, PPI and DPI extended position  He tends  to lean towards his right side, mild dsytonic posture of his right leg.  Assessment and Plan:  EMG guided BOTOX A injection,  Used 600 units of Botox A (has a lot of very active spontaneous activity, CRDs at right upper cervical paraspinal muscles (   Right levator scapulae 50 units Right splenius cervix 25 x2=50 units Right splenius capitis 50 units Right  semispinalis 50 units Right iliocostalis 25x2= 50 units Right upper trapezius 50 units Right rhomboid 25 units  Right palmaris longus 50 units Right flexor digitorum superficialis 25 units    Left levator scapulae 25 units Left upper trapezius 25 units Left iliocostalis 25 units Left splenius cervix 50 units Left rhomboid 50 units Left splenius capitis 25 units   He will come back in  3 months for repeat injections.   Levert Feinstein, M.D. Ph.D.  [facility] [address], [address] [place], [place] [address] Ph: [phone] Fax: [phone]  CC: Referring Provider

## 2020-12-01 ENCOUNTER — Ambulatory Visit: Payer: Managed Care, Other (non HMO) | Admitting: Neurology

## 2020-12-29 ENCOUNTER — Encounter: Payer: Self-pay | Admitting: Neurology

## 2020-12-29 ENCOUNTER — Ambulatory Visit: Payer: Managed Care, Other (non HMO) | Admitting: Neurology

## 2020-12-29 VITALS — BP 144/73 | HR 82 | Ht 68.0 in | Wt 295.0 lb

## 2020-12-29 DIAGNOSIS — G5640 Causalgia of unspecified upper limb: Secondary | ICD-10-CM

## 2020-12-29 DIAGNOSIS — G243 Spasmodic torticollis: Secondary | ICD-10-CM

## 2020-12-29 DIAGNOSIS — G249 Dystonia, unspecified: Secondary | ICD-10-CM | POA: Diagnosis not present

## 2020-12-29 NOTE — Progress Notes (Signed)
PATIENT: Brandon Lin DOB: 1970/10/24  HISTORICAL  Brandon Lin, is a 50 year-old right-handed Caucasian male, has suffered complex regional pain syndrome following a right shoulder injury in 1998, he is here every 3 months for EMG guided Botox injection for his neck, shoulder pain, right upper extremity spasticity.  He fell off a deck and landed on his right neck and shoulder area in 1998, he had recurrent right shoulder pain, later he had multiple surgeries, each procedure making his symptoms worse, he fell off landed on his right shoulder again in 2005, he has developed intractable pain in his right shoulder and neck, dystonic postural, eventually was diagnosed with complex regional pain syndrome. From 2005 to 2007, he suffered severe neck pain, bilateral shoulder pain, abnormal posture of his neck, right upper extremity,to Brandon point of difficult walking, fell many times.  He has been under [facility] care of Dr. Tyron Lin since 2006, receiving Botox injections every 90 days, which does help his right neck and shoulder pain.  Also under pain management at [facility], he had significant pain around early 2013, he began to receive inpatient ketamine infusion since March 2013, every 3 months, initially spreading over 10 days with stable daily osage, slowly infusion 4 hours each day, but in August 2014, Brandon infusion was cut back to 5 days, he most recent infusion in January 2015, he only received 3 days infusion.  He is on polypharmacy treatment,  He is taking baclofen.,Neurontin, Ativan 1 mg 3 times a day,  clonazepam, tizanidine, OxyCodone,  dosage is adjusted following his most recent [facility] admission for pain management at [facility] in December 2014, currently 30 plus 10 mg every 3 hours, He is also receiving IV ketamine treatment every 6 months at [facility]  His father died of leukemia at age 75, mother  is healthy. He is disabled due to his condition.  Married, has a son. 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    BOTOX has been very helpful, but he continues to have right neck, left neck and right  shoulder spastic pain, wearing off in 2 months.   He continues to get ketamine infusion from [facility],   There was some changes of his ketamine schedule, he got  3 days iv ketamine in Dec 2014 instead of previously scheduled 10 days, shortly after Brandon injection, around January 2015, he developed frequent painful body muscle jerking movement, we have telephone adjusted his medications, is now taking regular dose of clonazepam 1 mg 3 times a day, Ativan 1 mg every night, baclofen 20 medical twice a day, Depakote 500 mg every night, gabapentin 600 mg twice a day, oxycodone 30-10 mg every 3 hours, tizanidine 2 mg 2 tablets every 6 hours, his painful muscle spasm has much improved, he scheduled to have repeat IV ketamine infusion in June first for 10 days at [facility] again, his right upper extremity spasticity and pain has much improved with previous injection, most of pain is at  bilateral occipital, cervical, shoulder revision now  UPDATE Oct  2015: Last Botox injection in July 2015 has been helpful, but he has developed left neck weakness, lasting for one month, he is under a lot of stress, his wife is dealing with metastatic squamous cell carcinoma, he has changed to a new physician at [facility], worry about Brandon possibility of continued ketamine infusion, which has been very helpful for him.  UPDATE Jan 27th 2016: Last injection was in October 2015, which has been helpful, today he complains of intermittent right more than left upper extremity muscle spasm, right hand finger spasm, tenderness, bilateral neck, shoulder He also takes care of his wife, who has suffered metastatic cancer,  UPDATE Feb 22 2015: He is with  his wife at today's clinical visit, last EMG guided injection was in September 2016, heel has suffered GI symptoms over Brandon past few weeks, intractable nausea, with mild improvement now, his wife has finished her treatment, is now back to work at [facility]  UPDATE May 11th 2017: He saw his pain management physician at [facility],will be admitted to [facility] for inpatient pain control, he had multiple recurrent severe back muscle spasm, is now taking narcotics, baclofen, Botox continued to help his pain, he received 600 units of Botox injection in January 2017, no significant side effect noticed.  UPDATE August 17th 2017:  I reviewed [facility] visit in Jun 15 2015,he received to right upper extremity interscalene nerve block under ultrasound guidance, which fail to improve his symptoms, actually made his symptoms worse, he was not able to accomplish physical therapy as previously planned.  It is hard to tell result from previous Botox injection in Jun 10 2015  UPDATE Jan 12 2016: He responded very well to previous injection in August 2017, over Brandon past few months, he had frequent kidney stone.  UPDATE April 13 2015: He responded very well to previous Botox injection in December 2017,  UPDATE August 30 2016: He responded very well to previous injection on April 13 2015, in recent few weeks he noticed increased right hand finger cramping, deep achy pain,  Update January 04 2017: He responded well to previous injection, suffered kidney stone, no complaints of worsening left shoulder blade pain,  Update April 10, 2017: He is seeing new pain management data [facility], who have suggested him to taper off clonazepam  UPDATE July 03 2017: He responded very well to previous injection, no significant side effect noticed, he also spend more time in Brandon hot water tub, which has been helpful  UPDATE Sept 3 2019: He responded very well to previous Botox injection until a week ago, then he began  to have recurrent bilateral neck, shoulder pain.  UPDATE Jan 21 2018: He suffered kidney stone, with severe flank pain, also since early December 2019 he noticed numbness between his nipple line and knee level, but denied weakness, denies bowel and bladder incontinence, he has worsening gait abnormality due to recent fall, worsening right knee pain, previous Botox injection has helped him,  UPDATE April 30 2018: He responded very well to previous injection.  UPDATE July 31 2018: He responded very well to previous injection  UPDATE Nov 06 2018: He responded very well to previous injection.  UPDATE Mar 10 2019: He did well to previous injection, used to Botox 600 units  UPDATE July 02 2019: His neck, bilateral shoulder pain does respond to Botox injection  Update October 08, 2019: He responded well to previous injection, still has frequent bilateral neck, shoulder, right anterior chest muscle spasm  Update February 23, 2020: He responded well to previous injection, but with recent weather change, he noticed increased pain at right shoulder, neck region  UPDATE August 25 2020: He responded very well to previous injection, especially benefit from right arm injection,  Update December 29, 2020: This winter he reported worsening muscle spasm, enjoying hunting  REVIEW OF SYSTEMS: Full 14 system review of systems performed and notable only for as above  ALLERGIES: Allergies  Allergen Reactions   Diclofenac Sodium     HOME MEDICATIONS: Current Outpatient Medications  Medication Sig Dispense Refill   baclofen (LIORESAL) 20 MG tablet Take 20 mg by mouth 2 (two) times daily.     BOTOX 100 units SOLR injection INJECT 600 UNITS INTO Brandon CERVICAL MUSCLES AND RIGHT UPPER EXTREMITIES BY PROVIDER EVERY 3 MONTHS. DISCARD UNUSED PORTION. 6 each 3   ondansetron (ZOFRAN) 4 MG tablet TAKE 1 TABLET BY MOUTH  EVERY 8 HOURS AS NEEDED FOR NAUSEA OR VOMITING 60 tablet 3   OxyCODONE HCl (ROXICODONE PO)  Take by mouth as directed.     pregabalin (LYRICA) 150 MG capsule Take 150 mg by mouth 3 (three) times daily.     tamsulosin (FLOMAX) 0.4 MG CAPS capsule Take 0.4 mg by mouth.     XARELTO 20 MG TABS tablet      No current facility-administered medications for this visit.    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   Falls    Insomnia w/ sleep apnea    Other fragments of torsion dystonia     PAST SURGICAL HISTORY: Past Surgical History:  Procedure Laterality Date   KNEE SURGERY     SHOULDER SURGERY      FAMILY HISTORY: Family History  Problem Relation Age of Onset   Heart disease Mother    Leukemia Father     SOCIAL HISTORY:  Social History   Socioeconomic History   Marital status: Married    Spouse name: Not on file   Number of children: 1   Years of education: Not on file   Highest education level: Not on file  Occupational History   Not on file  Tobacco Use   Smoking status: Former   Smokeless tobacco: Never   Tobacco comments:    quit 18 years ago  Substance and Sexual Activity   Alcohol use: No   Drug use: No   Sexual activity: Not on file  Other Topics Concern   Not on file  Social History Narrative   Lives ath home w/ his wife   Social Determinants of Health   Financial Resource Strain: Not on file  Food Insecurity: Not on file  Transportation Needs: Not on file  Physical Activity: Not on file  Stress: Not on file  Social Connections: Not on file  Intimate Partner Violence: Not on file     PHYSICAL EXAM   Vitals:   12/29/20 1323  BP: (!) 144/73  Pulse: 82  Weight: 295 lb (133.8 kg)  Height: 5\' 8"  (1.727 m)   Not recorded     Body mass index is 44.85 kg/m.   Regional complex pain syndrome Right upper extremity and cervical dystonia  He has neck rotated to right, right tilt, right shoulder elevation, right shoulder in anterior  rotation, right elbow flexion and right hand MPI flexion, PPI and DPI extended position  He tends to lean towards his right side, mild dsytonic posture of his right leg.  Assessment and Plan:  EMG guided BOTOX A injection,  Used 600 units of Botox A (has a lot of very active spontaneous activity, CRDs at right upper cervical paraspinal muscles (   Right levator scapulae  50 units Right splenius cervix 25 x4=100units Right splenius capitis 50 units Right semispinalis 50 units Right iliocostalis 25x2= 50 units Right upper trapezius 50 units Right rhomboid 25 x3=75'units    Left levator scapulae 25 units Left upper trapezius 25 units Left iliocostalis 25 units Left splenius cervix 50 units Left rhomboid 50 units Left splenius capitis 25 units   He will come back in  3 months for repeat injections.   Levert Feinstein, M.D. Ph.D.  [facility] [address] [place], [place] [address] Ph: [phone] Fax: [phone]  CC: Referring Provider

## 2020-12-29 NOTE — Progress Notes (Signed)
Botox 100 units x 6 vials [id number] [id number]  Exp-09/2021  S/P

## 2021-03-09 ENCOUNTER — Ambulatory Visit: Payer: Managed Care, Other (non HMO) | Admitting: Neurology

## 2021-03-15 DIAGNOSIS — Z0289 Encounter for other administrative examinations: Secondary | ICD-10-CM

## 2021-03-28 ENCOUNTER — Telehealth: Payer: Self-pay | Admitting: Neurology

## 2021-03-28 MED ORDER — BOTOX 100 UNITS IJ SOLR: INTRAMUSCULAR | 3 refills | Status: DC

## 2021-03-28 NOTE — Telephone Encounter (Signed)
Refills sent to [facility].

## 2021-03-28 NOTE — Telephone Encounter (Signed)
Please send Botox refill to [facility] specialty pharmacy.  °

## 2021-04-06 ENCOUNTER — Ambulatory Visit (INDEPENDENT_AMBULATORY_CARE_PROVIDER_SITE_OTHER): Payer: Managed Care, Other (non HMO) | Admitting: Neurology

## 2021-04-06 DIAGNOSIS — G5640 Causalgia of unspecified upper limb: Secondary | ICD-10-CM

## 2021-04-06 DIAGNOSIS — G243 Spasmodic torticollis: Secondary | ICD-10-CM | POA: Diagnosis not present

## 2021-04-06 DIAGNOSIS — G249 Dystonia, unspecified: Secondary | ICD-10-CM | POA: Diagnosis not present

## 2021-04-06 NOTE — Progress Notes (Signed)
Botox-600 units x 6 vials  ?[id number] ?Exp-03/2023 ?[id number] ?sp ?

## 2021-04-06 NOTE — Progress Notes (Signed)
PATIENT: Brandon Lin DOB: [birth year]  HISTORICAL  Brandon Lin, is a 51 year-old right-handed Caucasian male, has suffered complex regional pain syndrome following a right shoulder injury in 1998, he is here every 3 months for EMG guided Botox injection for his neck, shoulder pain, right upper extremity spasticity.  He fell off a deck and landed on his right neck and shoulder area in 1998, he had recurrent right shoulder pain, later he had multiple surgeries, each procedure making his symptoms worse, he fell off landed on his right shoulder again in 2005, he has developed intractable pain in his right shoulder and neck, dystonic postural, eventually was diagnosed with complex regional pain syndrome. From 2005 to 2007, he suffered severe neck pain, bilateral shoulder pain, abnormal posture of his neck, right upper extremity,to the point of difficult walking, fell many times.  He has been under the care of Dr. Jodi Mourning since 2006, receiving Botox injections every 90 days, which does help his right neck and shoulder pain.  Also under pain management at [facility] anesthesiologist Dr. Genelle Bal, he had significant pain around early 2013, he began to receive inpatient ketamine infusion since March 2013, every 3 months, initially spreading over 10 days with stable daily osage, slowly infusion 4 hours each day, but in August 2014, the infusion was cut back to 5 days, he most recent infusion in January 2015, he only received 3 days infusion.  He is on polypharmacy treatment,  He is taking baclofen.,Neurontin, Ativan 1 mg 3 times a day,  clonazepam, tizanidine, OxyCodone,  dosage is adjusted following his most recent hospital admission for pain management at [facility] in December 2014, currently 30 plus 10 mg every 3 hours, He is also receiving IV ketamine treatment every 6 months at [facility]  His father died of leukemia at age 44, mother  is healthy. He is disabled due to his condition.  Married, has a son. 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    BOTOX has been very helpful, but he continues to have right neck, left neck and right  shoulder spastic pain, wearing off in 2 months.   He continues to get ketamine infusion from [facility],   There was some changes of his ketamine schedule, he got  3 days iv ketamine in Dec 2014 instead of previously scheduled 10 days, shortly after the injection, around January 2015, he developed frequent painful body muscle jerking movement, we have telephone adjusted his medications, is now taking regular dose of clonazepam 1 mg 3 times a day, Ativan 1 mg every night, baclofen 20 medical twice a day, Depakote 500 mg every night, gabapentin 600 mg twice a day, oxycodone 30-10 mg every 3 hours, tizanidine 2 mg 2 tablets every 6 hours, his painful muscle spasm has much improved, he scheduled to have repeat IV ketamine infusion in June first for 10 days at [facility] again, his right upper extremity spasticity and pain has much improved with previous injection, most of pain is at  bilateral occipital, cervical, shoulder revision now  UPDATE Oct  2015: Last Botox injection in July 2015 has been helpful, but he has developed left neck weakness, lasting for one month, he is under a lot of stress, his wife is dealing with metastatic squamous cell carcinoma, he has changed to a new physician at [name], worry about the possibility of continued ketamine infusion, which has been very helpful for him.  UPDATE Jan 27th 2016: Last injection was in October 2015, which has been helpful, today he complains of intermittent right more than left upper extremity muscle spasm, right hand finger spasm, tenderness, bilateral neck, shoulder He also takes care of his wife, who has suffered metastatic cancer,  UPDATE Feb 22 2015: He is with  his wife at today's clinical visit, last EMG guided injection was in September 2016, heel has suffered GI symptoms over the past few weeks, intractable nausea, with mild improvement now, his wife has finished her treatment, is now back to work at [employer]  UPDATE May 11th 2017: He saw his pain management physician at [name], he had multiple recurrent severe back muscle spasm, is now taking narcotics, baclofen, Botox continued to help his pain, he received 600 units of Botox injection in January 2017, no significant side effect noticed.  UPDATE August 17th 2017:  I reviewed [name] in [facility] 15 2015,he received to right upper extremity interscalene nerve block under ultrasound guidance, which fail to improve his symptoms, actually made his symptoms worse, he was not able to accomplish physical therapy as previously planned.  It is hard to tell result from previous Botox injection in Jun 10 2015  UPDATE Jan 12 2016: He responded very well to previous injection in August 2017, over the past few months, he had frequent kidney stone.  UPDATE April 13 2015: He responded very well to previous Botox injection in December 2017,  UPDATE August 30 2016: He responded very well to previous injection on April 13 2015, in recent few weeks he noticed increased right hand finger cramping, deep achy pain,  Update January 04 2017: He responded well to previous injection, suffered kidney stone, no complaints of worsening left shoulder blade pain,  Update April 10, 2017: He is seeing new pain management [name], who have suggested him to taper off clonazepam  UPDATE July 03 2017: He responded very well to previous injection, no significant side effect noticed, he also spend more time in the hot water tub, which has been helpful  UPDATE Sept 3 2019: He responded very well to previous Botox injection until a week ago, then he began  to have recurrent bilateral neck, shoulder pain.  UPDATE Jan 21 2018: He suffered kidney stone, with severe flank pain, also since early December 2019 he noticed numbness between his nipple line and knee level, but denied weakness, denies bowel and bladder incontinence, he has worsening gait abnormality due to recent fall, worsening right knee pain, previous Botox injection has helped him,  UPDATE April 30 2018: He responded very well to previous injection.  UPDATE July 31 2018: He responded very well to previous injection  UPDATE Nov 06 2018: He responded very well to previous injection.  UPDATE Mar 10 2019: He did well to previous injection, used to Botox 600 units  UPDATE July 02 2019: His neck, bilateral shoulder pain does respond to Botox injection  Update October 08, 2019: He responded well to previous injection, still has frequent bilateral neck, shoulder, right anterior chest muscle spasm  Update February 23, 2020: He responded well to previous injection, but with recent weather change, he noticed increased pain at right shoulder, neck region  UPDATE August 25 2020: He responded very well to previous injection, especially benefit from right arm injection,  Update December 29, 2020: This winter he reported worsening muscle spasm, enjoying hunting  Update April 06, 2021: He responded well to previous injection  REVIEW OF SYSTEMS: Full 14 system review of systems performed and notable only for as above   PHYSICAL EXAM   There were no vitals filed for this visit.   Regional complex pain syndrome Right upper extremity and cervical dystonia  He has neck rotated to right, right tilt, right shoulder elevation, right shoulder in anterior rotation, right elbow flexion and right hand MPI flexion, PPI and DPI extended position  He tends to lean towards his right side, mild dsytonic posture of his right leg.  Assessment and Plan:  EMG guided BOTOX A injection,  Used 600  units of Botox A (has a lot of very active spontaneous activity, CRDs at right upper cervical paraspinal muscles   Right levator scapulae 50 units Right splenius cervix 25 x4=100units Right splenius capitis 50 units Right semispinalis 50 units Right iliocostalis 25x2= 50 units Right upper trapezius 50 units Right rhomboid 25 x3=75'units    Left levator scapulae 25 units Left upper trapezius 25 units Left iliocostalis 25 units Left splenius cervix 50 units Left rhomboid 50 units Left splenius capitis 25 units   He will come back in  3 months for repeat injections.   Levert Feinstein, M.D. Ph.D.  [facility] [address] [place], [place] [address] Ph: [phone] Fax: [phone]  CC: Referring Provider

## 2021-04-28 NOTE — Telephone Encounter (Signed)
Faxed signed PA form with OV notes to [employer]. ? ? ?

## 2021-05-12 NOTE — Telephone Encounter (Signed)
Received approval from [place], [place] # [id number] (06/09/2021-06/10/2022). ?

## 2021-05-19 NOTE — Telephone Encounter (Signed)
Received (6) 100 unit vials of Botox from [facility]. ?

## 2021-06-29 ENCOUNTER — Encounter: Payer: Self-pay | Admitting: Neurology

## 2021-06-29 ENCOUNTER — Ambulatory Visit: Payer: Managed Care, Other (non HMO) | Admitting: Neurology

## 2021-06-29 VITALS — BP 150/88 | HR 87 | Ht 68.0 in | Wt 316.5 lb

## 2021-06-29 DIAGNOSIS — G5640 Causalgia of unspecified upper limb: Secondary | ICD-10-CM

## 2021-06-29 DIAGNOSIS — G243 Spasmodic torticollis: Secondary | ICD-10-CM

## 2021-06-29 NOTE — Progress Notes (Unsigned)
**  Botox 100 units x 6 vials, NDC 7673-4193-79, Lot [id number], Exp 12/2022, specialty pharmacy.//mck,rn**

## 2021-07-02 NOTE — Progress Notes (Signed)
PATIENT: Brandon Lin DOB: 1970/10/24  HISTORICAL  [name], is a 51 year-old right-handed Caucasian male, has suffered complex regional pain syndrome following a right shoulder injury in 1998, he is here every 3 months for EMG guided Botox injection for his neck, shoulder pain, right upper extremity spasticity.  He fell off a deck and landed on his right neck and shoulder area in 1998, he had recurrent right shoulder pain, later he had multiple surgeries, each procedure making his symptoms worse, he fell off landed on his right shoulder again in 2005, he has developed intractable pain in his right shoulder and neck, dystonic postural, eventually was diagnosed with complex regional pain syndrome. From 2005 to 2007, he suffered severe neck pain, bilateral shoulder pain, abnormal posture of his neck, right upper extremity,to the point of difficult walking, fell many times.  He has been under the care of Dr. Tyron Russell since 2006, receiving Botox injections every 90 days, which does help his right neck and shoulder pain.  Also under pain management at [facility] anesthesiologist Dr. Ilona Sorrel, he had significant pain around early 2013, he began to receive inpatient ketamine infusion since March 2013, every 3 months, initially spreading over 10 days with stable daily osage, slowly infusion 4 hours each day, but in August 2014, the infusion was cut back to 5 days, he most recent infusion in January 2015, he only received 3 days infusion.  He is on polypharmacy treatment,  He is taking baclofen.,Neurontin, Ativan 1 mg 3 times a day,  clonazepam, tizanidine, OxyCodone,  dosage is adjusted following his most recent hospital admission for pain management at [facility] in December 2014, currently 30 plus 10 mg every 3 hours, He is also receiving IV ketamine treatment every 6 months at [facility]  His father died of leukemia at age 75, mother  is healthy. He is disabled due to his condition.  Married, has a son. 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    BOTOX has been very helpful, but he continues to have right neck, left neck and right  shoulder spastic pain, wearing off in 2 months.   He continues to get ketamine infusion from [facility],   There was some changes of his ketamine schedule, he got  3 days iv ketamine in Dec 2014 instead of previously scheduled 10 days, shortly after the injection, around January 2015, he developed frequent painful body muscle jerking movement, we have telephone adjusted his medications, is now taking regular dose of clonazepam 1 mg 3 times a day, Ativan 1 mg every night, baclofen 20 medical twice a day, Depakote 500 mg every night, gabapentin 600 mg twice a day, oxycodone 30-10 mg every 3 hours, tizanidine 2 mg 2 tablets every 6 hours, his painful muscle spasm has much improved, he scheduled to have repeat IV ketamine infusion in June first for 10 days at [facility] again, his right upper extremity spasticity and pain has much improved with previous injection, most of pain is at  bilateral occipital, cervical, shoulder revision now  UPDATE Oct  2015: Last Botox injection in July 2015 has been helpful, but he has developed left neck weakness, lasting for one month, he is under a lot of stress, his wife is dealing with metastatic squamous cell carcinoma, he has changed to a new physician at [name], worry about the possibility of continued ketamine infusion, which has been very helpful for him.  UPDATE Jan 27th 2016: Last injection was in October 2015, which has been helpful, today he complains of intermittent right more than left upper extremity muscle spasm, right hand finger spasm, tenderness, bilateral neck, shoulder He also takes care of his wife, who has suffered metastatic cancer,  UPDATE Feb 22 2015: He is with  his wife at today's clinical visit, last EMG guided injection was in September 2016, heel has suffered GI symptoms over the past few weeks, intractable nausea, with mild improvement now, his wife has finished her treatment, is now back to work at [employer]  UPDATE May 11th 2017: He saw his pain management physician at [name],will be admitted to [facility] for inpatient pain control, he had multiple recurrent severe back muscle spasm, is now taking narcotics, baclofen, Botox continued to help his pain, he received 600 units of Botox injection in January 2017, no significant side effect noticed.  UPDATE August 17th 2017:  I reviewed [facility] visit in Jun 15 2015,he received to right upper extremity interscalene nerve block under ultrasound guidance, which fail to improve his symptoms, actually made his symptoms worse, he was not able to accomplish physical therapy as previously planned.  It is hard to tell result from previous Botox injection in Jun 10 2015  UPDATE Jan 12 2016: He responded very well to previous injection in August 2017, over the past few months, he had frequent kidney stone.  UPDATE April 13 2015: He responded very well to previous Botox injection in December 2017,  UPDATE August 30 2016: He responded very well to previous injection on April 13 2015, in recent few weeks he noticed increased right hand finger cramping, deep achy pain,  Update January 04 2017: He responded well to previous injection, suffered kidney stone, no complaints of worsening left shoulder blade pain,  Update April 10, 2017: He is seeing new pain management data [name], who have suggested him to taper off clonazepam  UPDATE July 03 2017: He responded very well to previous injection, no significant side effect noticed, he also spend more time in the hot water tub, which has been helpful  UPDATE Sept 3 2019: He responded very well to previous Botox injection until a week ago, then he began  to have recurrent bilateral neck, shoulder pain.  UPDATE Jan 21 2018: He suffered kidney stone, with severe flank pain, also since early December 2019 he noticed numbness between his nipple line and knee level, but denied weakness, denies bowel and bladder incontinence, he has worsening gait abnormality due to recent fall, worsening right knee pain, previous Botox injection has helped him,  UPDATE April 30 2018: He responded very well to previous injection.  UPDATE July 31 2018: He responded very well to previous injection  UPDATE Nov 06 2018: He responded very well to previous injection.  UPDATE Mar 10 2019: He did well to previous injection, used to Botox 600 units  UPDATE July 02 2019: His neck, bilateral shoulder pain does respond to Botox injection  Update October 08, 2019: He responded well to previous injection, still has frequent bilateral neck, shoulder, right anterior chest muscle spasm  Update February 23, 2020: He responded well to previous injection, but with recent weather change, he noticed increased pain at right shoulder, neck region  UPDATE August 25 2020: He responded very well to previous injection, especially benefit from right arm injection,  Update December 29, 2020: This winter he reported worsening muscle spasm, enjoying hunting  Update April 06, 2021: He responded well to previous injection  Update Jun 29, 2021, He responded well to previous injection  REVIEW OF SYSTEMS: Full 14 system review of systems performed and notable only for as above   PHYSICAL EXAM   Vitals:   06/29/21 1409  BP: (!) 150/88  Pulse: 87  Weight: (!) 316 lb 8 oz (143.6 kg)  Height: 5\' 8"  (1.727 m)     Regional complex pain syndrome Right upper extremity and cervical dystonia  He has neck rotated to right, right tilt, right shoulder elevation, right shoulder in anterior rotation, right elbow flexion and right hand MPI flexion, PPI and DPI extended position  He tends to  lean towards his right side, mild dsytonic posture of his right leg.  Assessment and Plan:  EMG guided BOTOX A injection,  Used 600 units of Botox A   Right levator scapulae 50 units Right splenius cervix 25 x4=100units Right splenius capitis 50 units Right semispinalis 50 units Right iliocostalis 25x2= 50 units Right upper trapezius 50 units Right rhomboid 25 x2=50 units  Right pronator teres 25 units Right palmaris longus 25 units Right flexor digitorum superficialis 25 units Right flexor digitorum profundus 25 units    Left levator scapulae 25 units Left upper trapezius 25 units Left iliocostalis 25 units Left splenius cervix 25 units    He will come back in  3 months for repeat injections.   , M.D. Ph.D.  [facility] [address] [place], [place] Ph: [phone] Fax: [phone]  CC: Referring Provider

## 2021-08-09 ENCOUNTER — Telehealth: Payer: Self-pay | Admitting: Neurology

## 2021-08-09 NOTE — Telephone Encounter (Signed)
[facility] ([place]) calling to notify of shipment delay for Botox. Pt [employer] termed out as of Jun 29 2021. Need to verify new medical insurance. Would like a call from the nurse.

## 2021-08-11 NOTE — Telephone Encounter (Signed)
I called patient to get an update on his insurance, pt states he will now have Medicare Part A & B and Medicare Advantage.

## 2021-09-15 ENCOUNTER — Telehealth: Payer: Self-pay | Admitting: Neurology

## 2021-09-15 NOTE — Telephone Encounter (Signed)
Spoke to pt, informed him that our office will start on PA and we will inform him on status in a few days. Pt voiced understanding.

## 2021-09-15 NOTE — Telephone Encounter (Signed)
Pt called stating that he has new insurance and its needing to be called for a PA for his BOTOX  appt on 8/23 [facility] Med Advantage  [id number] Rx [id number]

## 2021-09-19 ENCOUNTER — Telehealth: Payer: Self-pay

## 2021-09-19 NOTE — Telephone Encounter (Signed)
I spoke with Harriett Sine at [facility] who said prior authorization was on file for the patient's Botox injections. It is approved from 06/09/2021-06/10/2022. A copy of this authorization was faxed to our office. The preferred speciality pharmacy is [facility]. Reference #: Harriett Sine (09/19/2021).  A Neuromuscular Disorder Enrollment Form from [employer] has been filled out and is awaiting MD signature.

## 2021-09-20 NOTE — Telephone Encounter (Signed)
The signed PA form has been faxed to [facility]. Awaiting pharmacy authorization.

## 2021-09-21 ENCOUNTER — Ambulatory Visit: Payer: Managed Care, Other (non HMO) | Admitting: Neurology

## 2021-09-29 NOTE — Telephone Encounter (Signed)
There is 600 units of botox in the fridge for this patient that was sent by [facility] 05/17/21

## 2021-10-04 ENCOUNTER — Telehealth: Payer: Self-pay | Admitting: Neurology

## 2021-10-04 NOTE — Telephone Encounter (Signed)
LVM and sent mychart msg asking pt to call back to reschedule botox appointment

## 2021-10-04 NOTE — Telephone Encounter (Signed)
Please see phone note  

## 2021-10-10 NOTE — Telephone Encounter (Signed)
This request has received a Favorable outcome. Request Reference Number: [id number]. BOTOX INJ 100UNIT is approved through 01/04/2022. Your patient may now fill this prescription and it will be covered.

## 2021-10-26 ENCOUNTER — Telehealth: Payer: Self-pay | Admitting: Neurology

## 2021-10-26 ENCOUNTER — Ambulatory Visit: Payer: Medicare Other | Admitting: Neurology

## 2021-10-26 NOTE — Telephone Encounter (Signed)
Due to car not working wife has called to r/s pt's Botox appointment to 10-11 for a 2:30 appointment

## 2021-11-09 ENCOUNTER — Ambulatory Visit: Payer: Medicare Other | Admitting: Neurology

## 2021-11-24 ENCOUNTER — Telehealth: Payer: Self-pay | Admitting: *Deleted

## 2021-11-24 NOTE — Telephone Encounter (Signed)
Contacted spouse back, informed her the PA was done with [place], the insurance formation that was provided to [place] in Aug. She asked why or what was the exact copay again. I advised her to contact her insurance to see exactly what or why the copay was so high. I was unable to tell her exact amount, transferred her to billing.

## 2021-11-24 NOTE — Telephone Encounter (Signed)
I called wife of pt.  Was trying to see if she had phone # of the [facility] ADV plan to see about his BOTOX approval.  No longer has [place].  I called the local pharmacy and part D plan was [phone].  I called [employer] [phone] spoke to [place].  Approved for BOTOX 100 Units x 6 / 90 days.  10-05-2021 thru 01-04-2022, REF # [id number].  Wife to call back with main medical [employer] # for providers.  Need to get approval from medical side.

## 2021-11-24 NOTE — Telephone Encounter (Signed)
Pt wife is calling. She wants to make sure pt insurance is right because he should not have to pay a $400.00 co-pay. She is requesting a call back from nurse.

## 2021-11-28 NOTE — Telephone Encounter (Signed)
I called [employer]  spoke to [employer]. Technical sales engineer.  [phone].  I was following up on a previous PA that may have been done for pt.  She stated they did not have one for pt.  I asked if codes [id number], [phone], [phone] for BOTOX if needed prior auth, she stated no PA needed.  REF # [id number].

## 2021-11-30 ENCOUNTER — Ambulatory Visit: Payer: Medicare Other | Admitting: Neurology

## 2021-11-30 VITALS — BP 181/72 | HR 82 | Ht 68.0 in | Wt 316.0 lb

## 2021-11-30 DIAGNOSIS — G249 Dystonia, unspecified: Secondary | ICD-10-CM | POA: Diagnosis not present

## 2021-11-30 DIAGNOSIS — G243 Spasmodic torticollis: Secondary | ICD-10-CM

## 2021-11-30 MED ORDER — ONABOTULINUMTOXINA 100 UNITS IJ SOLR: 100.0000 [IU] | Freq: Once | INTRAMUSCULAR | Status: AC

## 2021-11-30 NOTE — Progress Notes (Signed)
Botox 100 units x 6 vials  [id number] [phone] Exp-10/2023 S/P

## 2021-11-30 NOTE — Progress Notes (Signed)
PATIENT: Brandon Lin DOB: 1970/10/24  HISTORICAL  Brandon Lin, is a 51 year-old right-handed Caucasian male, has suffered complex regional pain syndrome following a right shoulder injury in 1998, he is here every 3 months for EMG guided Botox injection for his neck, shoulder pain, right upper extremity spasticity.  He fell off a deck and landed on his right neck and shoulder area in 1998, he had recurrent right shoulder pain, later he had multiple surgeries, each procedure making his symptoms worse, he fell off landed on his right shoulder again in 2005, he has developed intractable pain in his right shoulder and neck, dystonic postural, eventually was diagnosed with complex regional pain syndrome. From 2005 to 2007, he suffered severe neck pain, bilateral shoulder pain, abnormal posture of his neck, right upper extremity,to the point of difficult walking, fell many times.  He has been under the care of Dr. Tyron Russell since 2006, receiving Botox injections every 90 days, which does help his right neck and shoulder pain.  Also under pain management at [facility] anesthesiologist Dr. Ilona Sorrel, he had significant pain around early 2013, he began to receive inpatient ketamine infusion since March 2013, every 3 months, initially spreading over 10 days with stable daily osage, slowly infusion 4 hours each day, but in August 2014, the infusion was cut back to 5 days, he most recent infusion in January 2015, he only received 3 days infusion.  He is on polypharmacy treatment,  He is taking baclofen.,Neurontin, Ativan 1 mg 3 times a day,  clonazepam, tizanidine, OxyCodone,  dosage is adjusted following his most recent hospital admission for pain management at [facility] in December 2014, currently 30 plus 10 mg every 3 hours, He is also receiving IV ketamine treatment every 6 months at [facility]  His father died of leukemia at age 75, mother  is healthy. He is disabled due to his condition.  Married, has a son. 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    BOTOX has been very helpful, but he continues to have right neck, left neck and right  shoulder spastic pain, wearing off in 2 months.   He continues to get ketamine infusion from [facility],   There was some changes of his ketamine schedule, he got  3 days iv ketamine in Dec 2014 instead of previously scheduled 10 days, shortly after the injection, around January 2015, he developed frequent painful body muscle jerking movement, we have telephone adjusted his medications, is now taking regular dose of clonazepam 1 mg 3 times a day, Ativan 1 mg every night, baclofen 20 medical twice a day, Depakote 500 mg every night, gabapentin 600 mg twice a day, oxycodone 30-10 mg every 3 hours, tizanidine 2 mg 2 tablets every 6 hours, his painful muscle spasm has much improved, he scheduled to have repeat IV ketamine infusion in June first for 10 days at [facility] again, his right upper extremity spasticity and pain has much improved with previous injection, most of pain is at  bilateral occipital, cervical, shoulder revision now  UPDATE Oct  2015: Last Botox injection in July 2015 has been helpful, but he has developed left neck weakness, lasting for one month, he is under a lot of stress, his wife is dealing with metastatic squamous cell carcinoma, he has changed to a new physician at [name], worry about the possibility of continued ketamine infusion, which has been very helpful for him.  UPDATE Jan 27th 2016: Last injection was in October 2015, which has been helpful, today he complains of intermittent right more than left upper extremity muscle spasm, right hand finger spasm, tenderness, bilateral neck, shoulder He also takes care of his wife, who has suffered metastatic cancer,  UPDATE Feb 22 2015: He is with  his wife at today's clinical visit, last EMG guided injection was in September 2016, heel has suffered GI symptoms over the past few weeks, intractable nausea, with mild improvement now, his wife has finished her treatment, is now back to work at [employer]  UPDATE May 11th 2017: He saw his pain management physician at [name],will be admitted to [facility] for inpatient pain control, he had multiple recurrent severe back muscle spasm, is now taking narcotics, baclofen, Botox continued to help his pain, he received 600 units of Botox injection in January 2017, no significant side effect noticed.  UPDATE August 17th 2017:  I reviewed [facility] visit in Jun 15 2015,he received to right upper extremity interscalene nerve block under ultrasound guidance, which fail to improve his symptoms, actually made his symptoms worse, he was not able to accomplish physical therapy as previously planned.  It is hard to tell result from previous Botox injection in Jun 10 2015  UPDATE Jan 12 2016: He responded very well to previous injection in August 2017, over the past few months, he had frequent kidney stone.  UPDATE April 13 2015: He responded very well to previous Botox injection in December 2017,  UPDATE August 30 2016: He responded very well to previous injection on April 13 2015, in recent few weeks he noticed increased right hand finger cramping, deep achy pain,  Update January 04 2017: He responded well to previous injection, suffered kidney stone, no complaints of worsening left shoulder blade pain,  Update April 10, 2017: He is seeing new pain management data [name], who have suggested him to taper off clonazepam  UPDATE July 03 2017: He responded very well to previous injection, no significant side effect noticed, he also spend more time in the hot water tub, which has been helpful  UPDATE Sept 3 2019: He responded very well to previous Botox injection until a week ago, then he began  to have recurrent bilateral neck, shoulder pain.  UPDATE Jan 21 2018: He suffered kidney stone, with severe flank pain, also since early December 2019 he noticed numbness between his nipple line and knee level, but denied weakness, denies bowel and bladder incontinence, he has worsening gait abnormality due to recent fall, worsening right knee pain, previous Botox injection has helped him,  UPDATE April 30 2018: He responded very well to previous injection.  UPDATE July 31 2018: He responded very well to previous injection  UPDATE Nov 06 2018: He responded very well to previous injection.  UPDATE Mar 10 2019: He did well to previous injection, used to Botox 600 units  UPDATE July 02 2019: His neck, bilateral shoulder pain does respond to Botox injection  Update October 08, 2019: He responded well to previous injection, still has frequent bilateral neck, shoulder, right anterior chest muscle spasm  Update February 23, 2020: He responded well to previous injection, but with recent weather change, he noticed increased pain at right shoulder, neck region  UPDATE August 25 2020: He responded very well to previous injection, especially benefit from right arm injection,  Update December 29, 2020: This winter he reported worsening muscle spasm, enjoying hunting  Update April 06, 2021: He responded well to previous injection  Update Jun 29, 2021, He responded well to previous injection ,  Update November 30, 2021: This is longer interval from last injection, complains of increased pain especially with recent cooler weather  REVIEW OF SYSTEMS: Full 14 system review of systems performed and notable only for as above   PHYSICAL EXAM   Vitals:   11/30/21 1440  BP: (!) 181/72  Pulse: 82  Weight: (!) 316 lb (143.3 kg)  Height: 5\' 8"  (1.727 m)     Regional complex pain syndrome Right upper extremity and cervical dystonia  He has neck rotated to right, right tilt, right shoulder  elevation, right shoulder in anterior rotation, right elbow flexion and right hand MPI flexion, PPI and DPI extended position  He tends to lean towards his right side, mild dsytonic posture of his right leg.  Assessment and Plan:  EMG guided BOTOX A injection,  Used 600 units of Botox A   Right levator scapulae 50 units Right splenius cervix 25 x4=100units Right splenius capitis 100 units Right semispinalis 75 units Right iliocostalis 25x2= 50 units Right upper trapezius 50 units Right rhomboid 25 x2=50 units   Left levator scapulae 50 units Left upper trapezius 25 units Left iliocostalis 25 units Left splenius cervix 25 units    He will come back in  3 months for repeat injections.   , M.D. Ph.D.  [facility] [address] [place], [place] Ph: [phone] Fax: [phone]  CC: Referring Provider  Chief

## 2022-01-16 ENCOUNTER — Telehealth: Payer: Self-pay | Admitting: Neurology

## 2022-01-16 NOTE — Telephone Encounter (Signed)
Pt is scheduled for botox injection for 02/22/22 and will need a new PA before appointment. Prior PA expired 01/04/22

## 2022-01-16 NOTE — Telephone Encounter (Signed)
Please obtain prior authorization for EMG guided Botox A 600 units. Dx code: G24.3

## 2022-01-19 ENCOUNTER — Other Ambulatory Visit (HOSPITAL_COMMUNITY): Payer: Self-pay

## 2022-01-19 NOTE — Telephone Encounter (Signed)
Patient Advocate Encounter   Received notification that prior authorization for Botox 200UNIT solution is required.   PA submitted on 01/19/2022 Key [id number] Status is pending       Roland Earl, CPhT Pharmacy Patient Advocate Specialist [facility] Patient Advocate Team Direct Number: [phone]  Fax: [phone]

## 2022-01-24 NOTE — Telephone Encounter (Signed)
Patient's Botox has been denied.

## 2022-01-24 NOTE — Telephone Encounter (Signed)
Patient Advocate Encounter  Received notification that the request for prior authorization for Botox 200UNIT solution  has been denied due to has not been At least three months have or will have elapsed since the last treatment with Botox. Patient last treatment was 11/30/2021.       Brandon Lin, CPhT Pharmacy Patient Advocate Specialist [facility] Patient Advocate Team Direct Number: [phone]  Fax: [phone]

## 2022-02-08 ENCOUNTER — Ambulatory Visit: Payer: Medicare Other | Admitting: Neurology

## 2022-02-14 NOTE — Telephone Encounter (Signed)
Can this auth please be redone? Pt is scheduled 02/22/22.

## 2022-02-15 ENCOUNTER — Other Ambulatory Visit (HOSPITAL_COMMUNITY): Payer: Self-pay

## 2022-02-15 NOTE — Telephone Encounter (Signed)
   The 24th of Jan will not have been 3 months since last injection.

## 2022-02-15 NOTE — Telephone Encounter (Signed)
I rescheduled his appointment to 03/07/2022, making it over 3 month since his last injection of 11/30/2021. Do you mind retrying when you get a chance? Thank you!

## 2022-02-22 ENCOUNTER — Ambulatory Visit: Payer: Medicare Other | Admitting: Neurology

## 2022-02-27 NOTE — Telephone Encounter (Signed)
Any updates on PA? Pt has an upcoming injection scheduled for 03/07/22

## 2022-02-28 ENCOUNTER — Other Ambulatory Visit (HOSPITAL_COMMUNITY): Payer: Self-pay

## 2022-02-28 NOTE — Telephone Encounter (Signed)
Pharmacy Patient Advocate Encounter   Received notification from [facility] that prior authorization for Botox 200UNIT solution is required/requested.    PA submitted on [id number] to (ins) [employer] via CoverMyMeds Key [id number] Status is pending

## 2022-02-28 NOTE — Telephone Encounter (Signed)
Pharmacy Patient Advocate Encounter  Prior Authorization for Botox 200UNIT solution has been approved.    PA#  PA Case ID: [id number] Effective dates: 02/28/2022 through 05/30/2022 Per test billing pt copay is $300.00  This can be filled with [facility]

## 2022-02-28 NOTE — Telephone Encounter (Signed)
BOTOX ONE-Benefit Verification [id number] Submitted!

## 2022-03-01 ENCOUNTER — Other Ambulatory Visit (HOSPITAL_COMMUNITY): Payer: Self-pay

## 2022-03-01 MED ORDER — BOTOX 100 UNITS IJ SOLR
INTRAMUSCULAR | 3 refills | Status: DC
  Filled 2022-03-01: qty 6, fill #0
  Filled 2022-03-02 (×3): qty 6, 84d supply, fill #0
  Filled 2022-05-18 – 2022-10-10 (×2): qty 6, 84d supply, fill #1

## 2022-03-01 NOTE — Addendum Note (Signed)
Addended by: Verlin Grills on: 03/01/2022 07:47 AM   Modules accepted: Orders

## 2022-03-01 NOTE — Telephone Encounter (Signed)
Can you please send Rx to [facility]? Thank you.

## 2022-03-01 NOTE — Telephone Encounter (Signed)
I have sent rx, thanks.

## 2022-03-02 ENCOUNTER — Other Ambulatory Visit: Payer: Self-pay

## 2022-03-02 ENCOUNTER — Other Ambulatory Visit (HOSPITAL_COMMUNITY): Payer: Self-pay

## 2022-03-03 ENCOUNTER — Other Ambulatory Visit (HOSPITAL_COMMUNITY): Payer: Self-pay

## 2022-03-03 ENCOUNTER — Other Ambulatory Visit: Payer: Self-pay

## 2022-03-06 ENCOUNTER — Other Ambulatory Visit: Payer: Self-pay

## 2022-03-07 ENCOUNTER — Ambulatory Visit: Payer: Medicare Other | Admitting: Neurology

## 2022-04-12 ENCOUNTER — Ambulatory Visit: Payer: Medicare Other | Admitting: Neurology

## 2022-05-18 ENCOUNTER — Other Ambulatory Visit (HOSPITAL_COMMUNITY): Payer: Self-pay

## 2022-05-22 ENCOUNTER — Telehealth: Payer: Self-pay | Admitting: Neurology

## 2022-05-22 NOTE — Telephone Encounter (Signed)
New auth needed before 5/22 appt, current auth expired 4/30. We already have his Botox in from [facility].

## 2022-05-30 ENCOUNTER — Telehealth: Payer: Self-pay | Admitting: Neurology

## 2022-05-30 NOTE — Telephone Encounter (Signed)
Put him on my schedule for May 2, 3pm, he has to drive from [place] here.

## 2022-05-30 NOTE — Telephone Encounter (Signed)
Pt is scheduled for 5/22 for Botox injection. Dr. Terrace Arabia will be out of the office from 5/21-6/20. IS pt able to be worked in for injection anywhere before vacation starts? It looks like he was originally supposed to come in February.

## 2022-05-30 NOTE — Telephone Encounter (Signed)
Per chart, patient had PA completed in August 2023 and has since then cancelled or no showed multiple times. Dr. Terrace Arabia is fully booked, patient should be ok to schedule after her vacation. Will CC Dr. Terrace Arabia on this message so she is aware.

## 2022-05-31 ENCOUNTER — Telehealth: Payer: Self-pay | Admitting: Neurology

## 2022-05-31 NOTE — Telephone Encounter (Signed)
Disregard

## 2022-05-31 NOTE — Telephone Encounter (Signed)
Scheduled but waiting to call after 8

## 2022-05-31 NOTE — Telephone Encounter (Signed)
Called and spoke to pt and stated: NEXT APPT With Neurology Levert Feinstein, MD) 06/01/2022 at 3:00 PM  He voiced gratitude and understanding

## 2022-05-31 NOTE — Telephone Encounter (Signed)
Also received approval via Cover My Meds: [id number] (05/31/22-08/31/22)

## 2022-05-31 NOTE — Telephone Encounter (Addendum)
Botox Berkley Harvey: [id number] (05/31/22-05/31/23) no auth needed for [address]11. We have Botox here from [facility].

## 2022-06-01 ENCOUNTER — Ambulatory Visit: Payer: Medicare Other | Admitting: Neurology

## 2022-06-08 ENCOUNTER — Other Ambulatory Visit (HOSPITAL_COMMUNITY): Payer: Self-pay

## 2022-06-21 ENCOUNTER — Ambulatory Visit: Payer: Medicare Other | Admitting: Neurology

## 2022-08-02 ENCOUNTER — Ambulatory Visit: Payer: Medicare Other | Admitting: Neurology

## 2022-08-02 VITALS — BP 116/54

## 2022-08-02 DIAGNOSIS — G243 Spasmodic torticollis: Secondary | ICD-10-CM

## 2022-08-02 DIAGNOSIS — G5640 Causalgia of unspecified upper limb: Secondary | ICD-10-CM

## 2022-08-02 MED ORDER — ONABOTULINUMTOXINA 100 UNITS IJ SOLR
600.0000 [IU] | Freq: Once | INTRAMUSCULAR | Status: AC
Start: 2022-08-02 — End: ?

## 2022-08-02 NOTE — Progress Notes (Signed)
Botox- 100 units x 6 vials Lot: [id number] Expiration: 06/2024 NDC: 5409-8119-14  Bacteriostatic 0.9% Sodium Chloride- 12 mL  Lot: [id number] Expiration: 05/01/23 NDC: 6213-0865-78  Dx: Mayer Camel S/P  Witnessed by Athena Masse RN

## 2022-08-02 NOTE — Progress Notes (Signed)
HISTORICAL  Brandon Lin, is a 52 year-old right-handed Caucasian male, has suffered complex regional pain syndrome following a right shoulder injury in 1998, he is here every 3 months for EMG guided Botox injection for his neck, shoulder pain, right upper extremity spasticity.  He fell off a deck and landed on his right neck and shoulder area in 1998, he had recurrent right shoulder pain, later he had multiple surgeries, each procedure making his symptoms worse, he fell off landed on his right shoulder again in 2005, he has developed intractable pain in his right shoulder and neck, dystonic postural, eventually was diagnosed with complex regional pain syndrome. From 2005 to 2007, he suffered severe neck pain, bilateral shoulder pain, abnormal posture of his neck, right upper extremity,to the point of difficult walking, fell many times.  He has been under the care of Dr. Jodi Mourning since 2006, receiving Botox injections every 90 days, which does help his right neck and shoulder pain.  Also under pain management at [facility] anesthesiologist Dr. Genelle Bal, he had significant pain around early 2013, he began to receive inpatient ketamine infusion since March 2013, every 3 months, initially spreading over 10 days with stable daily osage, slowly infusion 4 hours each day, but in August 2014, the infusion was cut back to 5 days, he most recent infusion in January 2015, he only received 3 days infusion.  He is on polypharmacy treatment,  He is taking baclofen.,Neurontin, Ativan 1 mg 3 times a day,  clonazepam, tizanidine, OxyCodone,  dosage is adjusted following his most recent hospital admission for pain management at [facility] in December 2014, currently 30 plus 10 mg every 3 hours, He is also receiving IV ketamine treatment every 6 months at [facility]  His father died of leukemia at age 30, mother  is healthy. He is disabled due to his condition. Married, has a son. 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    BOTOX has been very helpful, but he continues to have right neck, left neck and right  shoulder spastic pain, wearing off in 2 months.   He continues to get ketamine infusion from [facility],   There was some changes of his ketamine schedule, he got  3 days iv ketamine in Dec 2014 instead of previously scheduled 10 days, shortly after the injection, around January 2015, he developed frequent painful body muscle jerking movement, we have telephone adjusted his medications, is now taking regular dose of clonazepam 1 mg 3 times a day, Ativan 1 mg every night, baclofen 20 medical twice a day, Depakote 500 mg every night, gabapentin 600 mg twice a day, oxycodone 30-10 mg every 3 hours, tizanidine 2 mg 2 tablets every 6 hours, his painful muscle spasm has much improved, he scheduled to have repeat IV ketamine infusion in June first for 10 days at [facility] again, his right upper extremity spasticity and pain has much improved with previous injection, most of pain is at  bilateral occipital, cervical, shoulder revision now  UPDATE Oct  2015: Last Botox injection in July 2015 has been helpful, but he has developed left neck weakness, lasting for one month, he is under a lot of stress, his wife is dealing with metastatic squamous cell carcinoma, he has changed to a new physician at [facility], worry about the possibility  of continued ketamine infusion, which has been very helpful for him.  UPDATE Jan 27th 2016: Last injection was in October 2015, which has been helpful, today he complains of intermittent right more than left upper extremity muscle spasm, right hand finger spasm, tenderness, bilateral neck, shoulder He also takes care of his wife, who has suffered metastatic cancer,  UPDATE Feb 22 2015: He is with his wife at today's clinical visit, last  EMG guided injection was in September 2016, heel has suffered GI symptoms over the past few weeks, intractable nausea, with mild improvement now, his wife has finished her treatment, is now back to work at [employer]  UPDATE May 11th 2017: He saw his pain management physician at [name],will be admitted to [facility] for inpatient pain control, he had multiple recurrent severe back muscle spasm, is now taking narcotics, baclofen, Botox continued to help his pain, he received 600 units of Botox injection in January 2017, no significant side effect noticed.  UPDATE August 17th 2017:  I reviewed [facility] visit in Jun 15 2015,he received to right upper extremity interscalene nerve block under ultrasound guidance, which fail to improve his symptoms, actually made his symptoms worse, he was not able to accomplish physical therapy as previously planned.  It is hard to tell result from previous Botox injection in Jun 10 2015  UPDATE Jan 12 2016: He responded very well to previous injection in August 2017, over the past few months, he had frequent kidney stone.  UPDATE April 13 2015: He responded very well to previous Botox injection in December 2017,  UPDATE August 30 2016: He responded very well to previous injection on April 13 2015, in recent few weeks he noticed increased right hand finger cramping, deep achy pain,  Update January 04 2017: He responded well to previous injection, suffered kidney stone, no complaints of worsening left shoulder blade pain,  Update April 10, 2017: He is seeing new pain management data [name], who have suggested him to taper off clonazepam  UPDATE July 03 2017: He responded very well to previous injection, no significant side effect noticed, he also spend more time in the hot water tub, which has been helpful  UPDATE Sept 3 2019: He responded very well to previous Botox injection until a week ago, then he began to have recurrent bilateral neck, shoulder  pain.  UPDATE Jan 21 2018: He suffered kidney stone, with severe flank pain, also since early December 2019 he noticed numbness between his nipple line and knee level, but denied weakness, denies bowel and bladder incontinence, he has worsening gait abnormality due to recent fall, worsening right knee pain, previous Botox injection has helped him,  UPDATE April 30 2018: He responded very well to previous injection.  UPDATE July 31 2018: He responded very well to previous injection  UPDATE Nov 06 2018: He responded very well to previous injection.  UPDATE Mar 10 2019: He did well to previous injection, used to Botox 600 units  UPDATE July 02 2019: His neck, bilateral shoulder pain does respond to Botox injection  Update October 08, 2019: He responded well to previous injection, still has frequent bilateral neck, shoulder, right anterior chest muscle spasm  Update February 23, 2020: He responded well to previous injection, but with recent weather change, he noticed increased pain at right shoulder, neck region  UPDATE August 25 2020: He responded very well to previous injection, especially benefit from right arm injection,  Update December 29, 2020: This winter he  reported worsening muscle spasm, enjoying hunting  Update April 06, 2021: He responded well to previous injection  Update Jun 29, 2021, He responded well to previous injection ,  Update November 30, 2021: This is longer interval from last injection, complains of increased pain especially with recent cooler weather  UPDATE August 02 2022: He missed previous appointment, complains of worsening right shoulder tension and pain, also noticed bilateral lower extremity swelling, rash, on and off for few months, rash also spreading to upper extremity  REVIEW OF SYSTEMS: Full 14 system review of systems performed and notable only for as above   PHYSICAL EXAM   Vitals:   08/02/22 0948  BP: (!) 116/54   Regional complex pain  syndrome Right upper extremity and cervical dystonia  He has neck rotated to right, right tilt, right shoulder elevation, right shoulder in anterior rotation, right elbow flexion and right hand MPI flexion, PPI and DPI extended position  He tends to lean towards his right side, mild dsytonic posture of his right leg.  Assessment and Plan:  EMG guided BOTOX A injection,  Used 600 units of Botox A   Right levator scapulae 50 units Right splenius cervix 25 x4=100units Right splenius capitis 100 units Right semispinalis 100 units Right iliocostalis 25x2= 50 units Right upper trapezius 50 units Right rhomboid 25 x2=50 units   Left levator scapulae 25units Left upper trapezius 25 units Left iliocostalis 25 units Left splenius cervix 25 units    He will come back in  3 months for repeat injections.   Levert Feinstein, M.D. Ph.D.  [facility] [address] [place], [place] [address] Ph: [phone] Fax: [phone]  CC: Referring Provider  Chief

## 2022-08-03 LAB — CBC WITH DIFFERENTIAL
Basophils Absolute: 0 10*3/uL (ref 0.0–0.2)
Basos: 0 %
EOS (ABSOLUTE): 0.5 10*3/uL — ABNORMAL HIGH (ref 0.0–0.4)
Eos: 5 %
Hematocrit: 41 % (ref 37.5–51.0)
Hemoglobin: 13.8 g/dL (ref 13.0–17.7)
Immature Grans (Abs): 0.1 10*3/uL (ref 0.0–0.1)
Immature Granulocytes: 1 %
Lymphocytes Absolute: 1.1 10*3/uL (ref 0.7–3.1)
Lymphs: 11 %
MCH: 31.7 pg (ref 26.6–33.0)
MCHC: 33.7 g/dL (ref 31.5–35.7)
MCV: 94 fL (ref 79–97)
Monocytes Absolute: 1 10*3/uL — ABNORMAL HIGH (ref 0.1–0.9)
Monocytes: 10 %
Neutrophils Absolute: 7.1 10*3/uL — ABNORMAL HIGH (ref 1.4–7.0)
Neutrophils: 73 %
RBC: 4.35 x10E6/uL (ref 4.14–5.80)
RDW: 15.3 % (ref 11.6–15.4)
WBC: 9.7 10*3/uL (ref 3.4–10.8)

## 2022-08-03 LAB — TSH: TSH: 2.52 u[IU]/mL (ref 0.450–4.500)

## 2022-08-03 LAB — ANA W/REFLEX IF POSITIVE: Anti Nuclear Antibody (ANA): NEGATIVE

## 2022-08-03 LAB — COMPREHENSIVE METABOLIC PANEL
ALT: 49 IU/L — ABNORMAL HIGH (ref 0–44)
AST: 42 IU/L — ABNORMAL HIGH (ref 0–40)
Albumin: 4 g/dL (ref 3.8–4.9)
Alkaline Phosphatase: 99 IU/L (ref 44–121)
BUN/Creatinine Ratio: 15 (ref 9–20)
BUN: 10 mg/dL (ref 6–24)
Bilirubin Total: 0.3 mg/dL (ref 0.0–1.2)
CO2: 26 mmol/L (ref 20–29)
Calcium: 9.2 mg/dL (ref 8.7–10.2)
Chloride: 96 mmol/L (ref 96–106)
Creatinine, Ser: 0.67 mg/dL — ABNORMAL LOW (ref 0.76–1.27)
Globulin, Total: 2.9 g/dL (ref 1.5–4.5)
Glucose: 89 mg/dL (ref 70–99)
Potassium: 4.2 mmol/L (ref 3.5–5.2)
Sodium: 138 mmol/L (ref 134–144)
Total Protein: 6.9 g/dL (ref 6.0–8.5)
eGFR: 112 mL/min/{1.73_m2} (ref >59)

## 2022-08-03 LAB — C-REACTIVE PROTEIN: CRP: 30 mg/L — ABNORMAL HIGH (ref 0–10)

## 2022-08-03 LAB — CK: Total CK: 163 U/L (ref 41–331)

## 2022-08-03 LAB — SEDIMENTATION RATE: Sed Rate: 53 mm/hr — ABNORMAL HIGH (ref 0–30)

## 2022-08-14 DIAGNOSIS — L509 Urticaria, unspecified: Secondary | ICD-10-CM | POA: Insufficient documentation

## 2022-08-14 DIAGNOSIS — R600 Localized edema: Secondary | ICD-10-CM | POA: Insufficient documentation

## 2022-08-14 DIAGNOSIS — L309 Dermatitis, unspecified: Secondary | ICD-10-CM | POA: Insufficient documentation

## 2022-08-14 DIAGNOSIS — R748 Abnormal levels of other serum enzymes: Secondary | ICD-10-CM | POA: Insufficient documentation

## 2022-08-22 ENCOUNTER — Other Ambulatory Visit (HOSPITAL_COMMUNITY): Payer: Self-pay

## 2022-10-09 NOTE — Telephone Encounter (Signed)
Submitted auth renewal via CMM, status is pending. Key: [id number]

## 2022-10-10 ENCOUNTER — Other Ambulatory Visit (HOSPITAL_COMMUNITY): Payer: Self-pay

## 2022-10-17 ENCOUNTER — Other Ambulatory Visit: Payer: Self-pay

## 2022-10-17 NOTE — Telephone Encounter (Signed)
[facility] reached out because pt's copay to fill with them has gone up to $974. I obtained an auth through [facility] medical benefit for buy/bill hoping that this is a cheaper option for the pt.  [employer] Berkley Harvey: [id number] (10/17/22-10/17/23)

## 2022-10-25 ENCOUNTER — Ambulatory Visit: Payer: Medicare Other | Admitting: Neurology

## 2022-10-25 ENCOUNTER — Telehealth: Payer: Self-pay

## 2022-10-25 NOTE — Telephone Encounter (Signed)
Called and LVM for CB, also called the home number in chart and it would not allow me to leave a message

## 2022-10-25 NOTE — Telephone Encounter (Signed)
1st attempt: [facility] for missed appt today Dr. Terrace Arabia had verbally asked that we try to get him in so lets try to call 2 more times.

## 2022-10-25 NOTE — Telephone Encounter (Signed)
Called and soke to pt wife and she stated that pt is sick not covid but they plan to call Monday to see if he can be worked in on Wednesday since that is dr. Zannie Cove heavy injection day. Wife voiced understanding

## 2022-10-27 ENCOUNTER — Other Ambulatory Visit (HOSPITAL_BASED_OUTPATIENT_CLINIC_OR_DEPARTMENT_OTHER): Payer: Self-pay

## 2022-11-14 NOTE — Progress Notes (Unsigned)
Botox- 200 units x 3 vial Lot: [id number] Expiration: 02.2027 NDC: 0023-3921-02  Bacteriostatic 0.9% Sodium Chloride- 4 mL  Lot: [id number] Expiration: 04.01.2025 NDC: 6010932355  Dx: G24.3 B/B Witnessed by Morene Antu, RN

## 2022-11-15 ENCOUNTER — Telehealth: Payer: Self-pay | Admitting: Neurology

## 2022-11-15 ENCOUNTER — Ambulatory Visit: Payer: Medicare Other | Admitting: Neurology

## 2022-11-15 VITALS — BP 148/84

## 2022-11-15 DIAGNOSIS — R4781 Slurred speech: Secondary | ICD-10-CM | POA: Diagnosis not present

## 2022-11-15 DIAGNOSIS — G243 Spasmodic torticollis: Secondary | ICD-10-CM | POA: Diagnosis not present

## 2022-11-15 DIAGNOSIS — G5640 Causalgia of unspecified upper limb: Secondary | ICD-10-CM | POA: Diagnosis not present

## 2022-11-15 MED ORDER — ONABOTULINUMTOXINA 200 UNITS IJ SOLR
600.0000 [IU] | Freq: Once | INTRAMUSCULAR | Status: AC
Start: 2022-11-15 — End: 2022-11-15
  Administered 2022-11-15: 600 [IU] via INTRAMUSCULAR

## 2022-11-15 MED ORDER — ONABOTULINUMTOXINA 200 UNITS IJ SOLR
600.0000 [IU] | Freq: Once | INTRAMUSCULAR | Status: DC
Start: 2022-11-15 — End: 2022-11-15

## 2022-11-15 NOTE — Progress Notes (Signed)
Chief Complaint  Patient presents with   Injections    Rm14, wife present, pt is well and ready for injection   ASSESSMENT AND PLAN  Brandon Lin is a 52 y.o. male   Episode of sudden onset of confusion, slurred speech, Differentiation diagnosis including TIA, could not rule out the possibility of partial seizure  MRI of brain w/o Regional complex pain syndrome Right upper extremity and cervical dystonia  He has neck rotated to right, right tilt, right shoulder elevation, right shoulder in anterior rotation, right elbow flexion and right hand MPI flexion, PPI and DPI extended position   He tends to lean towards his right side, mild dsytonic posture of his right leg.   Assessment and Plan:   EMG guided BOTOX A injection,   Used 600 units of Botox A (100 units of BOTOX/2cc of NS)   Right levator scapulae 50 units Right splenius cervix 25 x4=100units Right splenius capitis 100 units Right semispinalis 100 units Right iliocostalis 25x2= 50 units Right upper trapezius 50 units Right rhomboid 25 x2=50 units     Left levator scapulae 25units Left upper trapezius 25 units Left iliocostalis 25 units Left splenius cervix 25 units    DIAGNOSTIC DATA (LABS, IMAGING, TESTING) - I reviewed patient records, labs, notes, testing and imaging myself where available.   MEDICAL HISTORY:   Brandon Lin, is a 52 year-old right-handed Caucasian male, has suffered complex regional pain syndrome following a right shoulder injury in 1998, he is here every 3 months for EMG guided Botox injection for his neck, shoulder pain, right upper extremity spasticity.  He fell off a deck and landed on his right neck and shoulder area in 1998, he had recurrent right shoulder pain, later he had multiple surgeries, each procedure making his symptoms worse, he fell off landed on his right shoulder again in 2005, he has developed intractable pain in his right shoulder and neck, dystonic postural,  eventually was diagnosed with complex regional pain syndrome. From 2005 to 2007, he suffered severe neck pain, bilateral shoulder pain, abnormal posture of his neck, right upper extremity,to the point of difficult walking, fell many times.  He has been under the care of Dr. Jodi Mourning since 2006, receiving Botox injections every 90 days, which does help his right neck and shoulder pain.  Also under pain management at [facility] anesthesiologist Dr. Genelle Bal, he had significant pain around early 2013, he began to receive inpatient ketamine infusion since March 2013, every 3 months, initially spreading over 10 days with stable daily osage, slowly infusion 4 hours each day, but in August 2014, the infusion was cut back to 5 days, he most recent infusion in January 2015, he only received 3 days infusion.  He is on polypharmacy treatment,  He is taking baclofen.,Neurontin, Ativan 1 mg 3 times a day,  clonazepam, tizanidine, OxyCodone,  dosage is adjusted following his most recent hospital admission for pain management at [facility] in December 2014, currently 30 plus 10 mg every 3 hours, He is also receiving IV ketamine treatment every 6 months at [facility]  His father died of leukemia at age 20, mother  is healthy. He is disabled due to his condition. Married, has a son. 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    BOTOX has been very helpful, but he continues  to have right neck, left neck and right  shoulder spastic pain, wearing off in 2 months.   He is to to get ketamine infusion from [facility],   There was some changes of his ketamine schedule, he got  3 days iv ketamine in Dec 2014 instead of previously scheduled 10 days, shortly after the injection, around January 2015, he developed frequent painful body muscle jerking movement, we have telephone adjusted his  medications, is now taking regular dose of clonazepam 1 mg 3 times a day, Ativan 1 mg every night, baclofen 20 medical twice a day, Depakote 500 mg every night, gabapentin 600 mg twice a day, oxycodone 30-10 mg every 3 hours, tizanidine 2 mg 2 tablets every 6 hours, his painful muscle spasm has much improved, he scheduled to have repeat IV ketamine infusion in June first for 10 days at [facility] again, his right upper extremity spasticity and pain has much improved with previous injection, most of pain is at  bilateral occipital, cervical, shoulder revision now,   he was not able to continue ketamine infusions through [facility],  I have seen him since 2015, receiving every 3 months Botox injection for significant left more than right neck shoulder pain, posturing of right hand, which has managed his pain  In addition he was seen by local pain management, taking oxycodone ER 60 mg twice a day, immediate release 30 mg as needed, warm compression is also helpful  He recently suffered frequent UTI, a week ago, he also experienced 1 episode starting with blurry vision, progressed to stuttering of the speech, by nighttime, he got confused, needing help getting up from toilet, mumbling, then snapped out of it few minutes later, he recently has worsening gait abnormality due to right knee pain, worsening bilateral fasciitis, tends to stay in bed, rarely get out of house.   PHYSICAL EXAM:   Vitals:   11/15/22 0838  BP: (!) 148/84    There is no height or weight on file to calculate BMI.  PHYSICAL EXAMNIATION:  Gen: NAD, conversant, well nourised, well groomed                     Cardiovascular: Regular rate rhythm, no peripheral edema, warm, nontender. Eyes: Conjunctivae clear without exudates or hemorrhage Neck: Supple, no carotid bruits. Pulmonary: Clear to auscultation bilaterally   NEUROLOGICAL EXAM:  MENTAL STATUS: Speech/cognition: Awake, alert, oriented to history taking and  casual conversation CRANIAL NERVES: CN II: Visual fields are full to confrontation. Pupils are round equal and briskly reactive to light. CN III, IV, VI: extraocular movement are normal. No ptosis. CN V: Facial sensation is intact to light touch CN VII: Face is symmetric with normal eye closure  CN VIII: Hearing is normal to causal conversation. CN IX, X: Phonation is normal. CN XI: Head turning and shoulder shrug are intact  MOTOR:  He has neck rotated to right, mild to moderate right tilt, mild right shoulder elevation, right shoulder in anterior rotation, right elbow flexion and right hand MPI flexion, PPI and DPI extended position   He tends to lean towards his right side, mild dsytonic posture of his right leg.  REFLEXES: Reflexes are 1 and symmetric at the biceps, triceps, knees, and ankles.    SENSORY: some allodynia at right shoulder  COORDINATION: There is no trunk or limb dysmetria noted.  GAIT/STANCE: Push-up to get up from seated position, antalgic,  REVIEW OF SYSTEMS:  Full 14 system review of systems performed  and notable only for as above All other review of systems were negative.   ALLERGIES: Allergies  Allergen Reactions   Diclofenac Sodium     HOME MEDICATIONS: Current Outpatient Medications  Medication Sig Dispense Refill   baclofen (LIORESAL) 20 MG tablet Take 20 mg by mouth 2 (two) times daily.     botulinum toxin Type A (BOTOX) 100 units SOLR injection INJECT 600 UNITS INTO THE CERVICAL MUSCLES AND RIGHT UPPER EXTREMITIES BY PROVIDER EVERY 3 MONTHS. DISCARD UNUSED PORTION. 6 each 3   ondansetron (ZOFRAN) 4 MG tablet TAKE 1 TABLET BY MOUTH  EVERY 8 HOURS AS NEEDED FOR NAUSEA OR VOMITING 60 tablet 3   OxyCODONE HCl (ROXICODONE PO) Take by mouth as directed.     pregabalin (LYRICA) 150 MG capsule Take 150 mg by mouth 3 (three) times daily.     tamsulosin (FLOMAX) 0.4 MG CAPS capsule Take 0.4 mg by mouth.     XARELTO 20 MG TABS tablet      Current  Facility-Administered Medications  Medication Dose Route Frequency Provider Last Rate Last Admin   botulinum toxin Type A (BOTOX) injection 100 Units  100 Units Intramuscular Once Levert Feinstein, MD       botulinum toxin Type A (BOTOX) injection 600 Units  600 Units Intramuscular Once Levert Feinstein, MD       botulinum toxin Type A (BOTOX) injection 600 Units  600 Units Intramuscular Once Levert Feinstein, MD        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   Falls    Insomnia w/ sleep apnea    Other fragments of torsion dystonia     PAST SURGICAL HISTORY: Past Surgical History:  Procedure Laterality Date   KNEE SURGERY     SHOULDER SURGERY      FAMILY HISTORY: Family History  Problem Relation Age of Onset   Heart disease Mother    Leukemia Father     SOCIAL HISTORY: Social History   Socioeconomic History   Marital status: Married    Spouse name: Not on file   Number of children: 1   Years of education: Not on file   Highest education level: Not on file  Occupational History   Not on file  Tobacco Use   Smoking status: Former   Smokeless tobacco: Never   Tobacco comments:    quit 18 years ago  Substance and Sexual Activity   Alcohol use: No   Drug use: No   Sexual activity: Not on file  Other Topics Concern   Not on file  Social History Narrative   Lives ath home w/ his wife   Social Determinants of Health   Financial Resource Strain: Not on file  Food Insecurity: Not on file  Transportation Needs: Not on file  Physical Activity: Not on file  Stress: Not on file  Social Connections: Not on file  Intimate Partner Violence: Not on file      Levert Feinstein, M.D. Ph.D.  [facility] [address] [place], [place] [address] Ph: [phone] Fax: [phone]  CC:  Luciana Axe, MD [address] [place],  [place] [address]  Luciana Axe, MD

## 2022-11-15 NOTE — Telephone Encounter (Signed)
[employer] medicare NPR sent to GI [phone] 

## 2022-12-01 DIAGNOSIS — G8911 Acute pain due to trauma: Secondary | ICD-10-CM | POA: Insufficient documentation

## 2022-12-05 ENCOUNTER — Other Ambulatory Visit: Payer: Medicare Other

## 2023-01-05 ENCOUNTER — Other Ambulatory Visit (HOSPITAL_COMMUNITY): Payer: Self-pay

## 2023-01-17 ENCOUNTER — Ambulatory Visit: Payer: Medicare Other | Admitting: Neurology

## 2023-01-19 ENCOUNTER — Other Ambulatory Visit: Payer: Self-pay

## 2023-01-19 NOTE — Progress Notes (Signed)
Disenrolled - patient receiving medication through buy-bill.

## 2023-02-07 ENCOUNTER — Ambulatory Visit: Payer: Medicare Other | Admitting: Neurology

## 2023-02-07 ENCOUNTER — Encounter: Payer: Self-pay | Admitting: Neurology

## 2023-03-16 DIAGNOSIS — S8991XS Unspecified injury of right lower leg, sequela: Secondary | ICD-10-CM | POA: Insufficient documentation

## 2023-07-02 ENCOUNTER — Telehealth: Payer: Self-pay | Admitting: Neurology

## 2023-07-02 NOTE — Telephone Encounter (Signed)
 LVM for pt and his wife per billing manager Angie's request. They needed to know why they were B/B vs being SP for previous October injection. It was going to cost almost $1,000 for pt to fill through the SP. Pt saved about $200 doinBotox  as B/B through us , thus the reason for the $700 balance.

## 2023-07-04 NOTE — Telephone Encounter (Signed)
 Pt Wife called back , informed coordinator wasn't available . Please give Pt wife call back .

## 2023-08-02 ENCOUNTER — Encounter: Payer: Self-pay | Admitting: Neurology

## 2023-08-02 NOTE — Telephone Encounter (Signed)
 I never received a teams message asking if I was available or was routed this call. I responded to a MyChart message the patient sent us  this morning.

## 2023-08-28 MED ORDER — BOTOX 100 UNITS IJ SOLR: INTRAMUSCULAR | 3 refills | Status: DC

## 2023-08-28 NOTE — Telephone Encounter (Signed)
 Please send rx to [facility], pt is requesting for them to fill.

## 2023-08-28 NOTE — Telephone Encounter (Signed)
 [facility] pharmacy benefit auth#: [id number] (08/28/23-11/28/23)

## 2023-08-28 NOTE — Telephone Encounter (Signed)
 Pt has called to discuss his appointment tomorrow, phone rep was advised by botox  coordinator that someone will contact pt today, message relayed to pt.

## 2023-08-28 NOTE — Addendum Note (Signed)
 Addended by: ONEITA HOIST E on: 08/28/2023 11:26 AM   Modules accepted: Orders

## 2023-08-29 ENCOUNTER — Ambulatory Visit: Admitting: Neurology

## 2023-09-03 ENCOUNTER — Telehealth: Payer: Self-pay | Admitting: Neurology

## 2023-09-03 NOTE — Telephone Encounter (Signed)
 Mai from [facility] called to get Pt Botox  delivery    Botox  Delivery Date :8-6-20205 6 valves 600 units Quantity of 6 for 30 day supply  Sig Req.

## 2023-09-03 NOTE — Telephone Encounter (Signed)
Updated appt note

## 2023-09-05 ENCOUNTER — Ambulatory Visit: Admitting: Neurology

## 2023-09-19 ENCOUNTER — Ambulatory Visit: Admitting: Neurology

## 2023-09-19 ENCOUNTER — Telehealth: Payer: Self-pay | Admitting: Neurology

## 2023-09-19 NOTE — Telephone Encounter (Signed)
 Pt has been r/s due to being sick

## 2023-09-19 NOTE — Telephone Encounter (Signed)
 Noted

## 2023-10-03 ENCOUNTER — Telehealth: Payer: Self-pay | Admitting: Neurology

## 2023-10-03 ENCOUNTER — Ambulatory Visit: Admitting: Neurology

## 2023-10-03 ENCOUNTER — Encounter: Payer: Self-pay | Admitting: Neurology

## 2023-10-03 NOTE — Telephone Encounter (Signed)
 Pt wife called to cancel  pt appt due to car trouble   Appt rescheduled

## 2023-10-31 ENCOUNTER — Ambulatory Visit: Admitting: Neurology

## 2023-10-31 ENCOUNTER — Encounter: Payer: Self-pay | Admitting: Neurology

## 2023-10-31 VITALS — BP 120/78 | HR 90 | Ht 68.0 in | Wt 309.5 lb

## 2023-10-31 DIAGNOSIS — R4781 Slurred speech: Secondary | ICD-10-CM

## 2023-10-31 DIAGNOSIS — G243 Spasmodic torticollis: Secondary | ICD-10-CM | POA: Diagnosis not present

## 2023-10-31 DIAGNOSIS — G5640 Causalgia of unspecified upper limb: Secondary | ICD-10-CM

## 2023-10-31 MED ORDER — LIDOCAINE-PRILOCAINE 2.5-2.5 % EX CREA: TOPICAL_CREAM | CUTANEOUS | 11 refills | Status: DC

## 2023-10-31 MED ORDER — OXCARBAZEPINE 150 MG PO TABS: 150.0000 mg | ORAL_TABLET | Freq: Two times a day (BID) | ORAL | 11 refills | Status: DC

## 2023-10-31 MED ORDER — ONABOTULINUMTOXINA 100 UNITS IJ SOLR: 600.0000 [IU] | Freq: Once | INTRAMUSCULAR | Status: AC

## 2023-10-31 NOTE — Progress Notes (Unsigned)
 Botox - 100 units x 6 vial Lot: [id number] Expiration: 11/2025 NDC: 9976-8854-98  Bacteriostatic 0.9% Sodium Chloride- 6mL total Lot: [id number] Expiration: 9590-8033-97 NDC: 36676-075-96  Dx: G24.3 S/P   Witnessed By: April Jones, RN

## 2023-10-31 NOTE — Progress Notes (Unsigned)
 Chief Complaint  Patient presents with   EMGRM4/BOTOX     Pt is here with his Wife . Cervical Dystonia.   ASSESSMENT AND PLAN  Brandon Lin is a 53 y.o. 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   Assessment and Plan:   EMG guided BOTOX  A injection,   Used 600 units of Botox  A (100 units of BOTOX /2cc of NS)   Right levator scapulae 50 units Right splenius cervix 25 x4=100units Right splenius capitis 100 units Right semispinalis 100 units Right iliocostalis 25x2= 50 units Right upper trapezius 50 units Right rhomboid 25 x2=50 units     Left levator scapulae 25units Left upper trapezius 25 units Left iliocostalis 25 units Left splenius cervix 25 units    DIAGNOSTIC DATA (LABS, IMAGING, TESTING) - I reviewed patient records, labs, notes, testing and imaging myself where available.   MEDICAL HISTORY:   Brandon Lin, is a 53 year-old right-handed Caucasian male, has suffered complex regional pain syndrome following a right shoulder injury in 1998, he is here every 3 months for EMG guided Botox  injection for his neck, shoulder pain, right upper extremity spasticity.  He fell off a deck and landed on his right neck and shoulder area in 1998, he had recurrent right shoulder pain, later he had multiple surgeries, each procedure making his symptoms worse, he fell off landed on his right shoulder again in 2005, he has developed intractable pain in his right shoulder and neck, dystonic postural, eventually was diagnosed with complex regional pain syndrome. From 2005 to 2007, he suffered severe neck pain, bilateral shoulder pain, abnormal posture of his neck, right upper  extremity,to the point of difficult walking, fell many times.  He has been under the care of Dr. Elbert since 2006, receiving Botox  injections every 90 days, which does help his right neck and shoulder pain.  Also under pain management at [facility] anesthesiologist Dr. Elspeth Bolls, he had significant pain around early 2013, he began to receive inpatient ketamine infusion since March 2013, every 3 months, initially spreading over 10 days with stable daily osage, slowly infusion 4 hours each day, but in August 2014, the infusion was cut back to 5 days, he most recent infusion in January 2015, he only received 3 days infusion.  He is on polypharmacy treatment,  He is taking baclofen.,Neurontin, Ativan  1 mg 3 times a day,  clonazepam , tizanidine , OxyCodone,  dosage is adjusted following his most recent hospital admission for pain management at [facility] in December 2014, currently 30 plus 10 mg every 3 hours, He is also receiving IV ketamine treatment every 6 months at [facility]  His father died of leukemia at age 68, mother  is healthy. He is disabled due to his condition. Married, has a son. 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    BOTOX  has been very helpful, but he continues to have right neck, left neck and right  shoulder spastic pain, wearing off in 2 months.   He is to to get ketamine infusion from  [facility],   There was some changes of his ketamine schedule, he got  3 days iv ketamine in Dec 2014 instead of previously scheduled 10 days, shortly after the injection, around January 2015, he developed frequent painful body muscle jerking movement, we have telephone adjusted his medications, is now taking regular dose of clonazepam  1 mg 3 times a day, Ativan  1 mg every night, baclofen 20 medical twice a day, Depakote  500 mg every night, gabapentin 600 mg twice  a day, oxycodone 30-10 mg every 3 hours, tizanidine  2 mg 2 tablets every 6 hours, his painful muscle spasm has much improved, he scheduled to have repeat IV ketamine infusion in June first for 10 days at [facility] again, his right upper extremity spasticity and pain has much improved with previous injection, most of pain is at  bilateral occipital, cervical, shoulder revision now,   he was not able to continue ketamine infusions through [facility],  I have seen him since 2015, receiving every 3 months Botox  injection for significant left more than right neck shoulder pain, posturing of right hand, which has managed his pain  In addition he was seen by local pain management, taking oxycodone ER 60 mg twice a day, immediate release 30 mg as needed, warm compression is also helpful  He recently suffered frequent UTI, a week ago, he also experienced 1 episode starting with blurry vision, progressed to stuttering of the speech, by nighttime, he got confused, needing help getting up from toilet, mumbling, then snapped out of it few minutes later, he recently has worsening gait abnormality due to right knee pain, worsening bilateral fasciitis, tends to stay in bed, rarely get out of house.  UPDATE Nov 01 2023: He continues to have significant allodynia, muscle spastic pain at bilateral shoulder, right upper extremity, to respond to Botox  injection, has been treated with oxycodone, getting prescription from primary care, Lyrica 150 mg 3 times a day, will add on Emla gel, Trileptal 150 mg twice a day  PHYSICAL EXAM:   Vitals:   10/31/23 1414  BP: 120/78  Pulse: 90  SpO2: 98%  Weight: (!) 309 lb 8 oz (140.4 kg)  Height: 5' 8 (1.727 m)    Body mass index is 47.06 kg/m.  PHYSICAL EXAMNIATION:  Gen: NAD, conversant, well nourised, well groomed                     Cardiovascular: Regular rate rhythm, no peripheral edema, warm, nontender. Eyes: Conjunctivae clear without exudates or  hemorrhage Neck: Supple, no carotid bruits. Pulmonary: Clear to auscultation bilaterally   NEUROLOGICAL EXAM:  MENTAL STATUS: Speech/cognition: Awake, alert, oriented to history taking and casual conversation CRANIAL NERVES: CN II: Visual fields are full to confrontation. Pupils are round equal and briskly reactive to light. CN III, IV, VI: extraocular movement are normal. No ptosis. CN V: Facial sensation is intact to light touch CN VII: Face is symmetric with normal eye closure  CN VIII: Hearing is normal to causal conversation. CN IX, X: Phonation is normal. CN XI: Head turning and shoulder shrug are intact  MOTOR:  He has neck rotated to right, mild to moderate right tilt, mild right shoulder elevation, right shoulder in anterior rotation, right elbow flexion and right hand MPI flexion, PPI and DPI extended position   He tends to lean towards his right side, mild dsytonic posture of his right leg.  REFLEXES: Reflexes are 1 and symmetric at the biceps, triceps, knees, and ankles.  SENSORY: some allodynia at right shoulder  COORDINATION: There is no trunk or limb dysmetria noted.  GAIT/STANCE: Push-up to get up from seated position, antalgic, dragging right leg  REVIEW OF SYSTEMS:  Full 14 system review of systems performed and notable only for as above All other review of systems were negative.   ALLERGIES: Allergies  Allergen Reactions   Diclofenac Sodium     HOME MEDICATIONS: Current Outpatient Medications  Medication Sig Dispense Refill   baclofen (LIORESAL) 20 MG tablet Take 20 mg by mouth 2 (two) times daily.     botulinum toxin Type A  (BOTOX ) 100 units SOLR injection INJECT 600 UNITS INTO THE CERVICAL MUSCLES AND RIGHT UPPER EXTREMITIES BY PROVIDER EVERY 3 MONTHS. DISCARD UNUSED PORTION. 6 each 3   ondansetron  (ZOFRAN ) 4 MG tablet TAKE 1 TABLET BY MOUTH  EVERY 8 HOURS AS NEEDED FOR NAUSEA OR VOMITING 60 tablet 3   OxyCODONE HCl (ROXICODONE PO) Take by  mouth as directed.     pregabalin (LYRICA) 150 MG capsule Take 150 mg by mouth 3 (three) times daily.     tamsulosin (FLOMAX) 0.4 MG CAPS capsule Take 0.4 mg by mouth.     XARELTO 20 MG TABS tablet      Current Facility-Administered Medications  Medication Dose Route Frequency Provider Last Rate Last Admin   botulinum toxin Type A  (BOTOX ) injection 100 Units  100 Units Intramuscular Once Onita Duos, MD       botulinum toxin Type A  (BOTOX ) injection 600 Units  600 Units Intramuscular Once Onita Duos, MD       botulinum toxin Type A  (BOTOX ) injection 600 Units  600 Units Intramuscular Once Onita Duos, MD        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   Falls    Insomnia w/ sleep apnea    Other fragments of torsion dystonia     PAST SURGICAL HISTORY: Past Surgical History:  Procedure Laterality Date   KNEE SURGERY     SHOULDER SURGERY      FAMILY HISTORY: Family History  Problem Relation Age of Onset   Heart disease Mother    Leukemia Father     SOCIAL HISTORY: Social History   Socioeconomic History   Marital status: Married    Spouse name: Not on file   Number of children: 1   Years of education: Not on file   Highest education level: Not on file  Occupational History   Not on file  Tobacco Use   Smoking status: Former   Smokeless tobacco: Never   Tobacco comments:    quit 18 years ago  Substance and Sexual Activity   Alcohol use: No   Drug use: No   Sexual activity: Not on file  Other Topics Concern   Not on file  Social History Narrative   Lives ath home w/ his wife   Social Drivers of Corporate investment banker Strain: Not on file  Food Insecurity: Not on file  Transportation Needs: Not on file  Physical Activity: Not on file  Stress: Not on file  Social Connections: Not on file  Intimate Partner Violence: Not on file      Duos Onita, M.D.  Ph.D.  [facility] [address] [place], [place] [address] Ph: [phone] Fax: [phone]  CC:  [place] , Norm BROCKS, MD [address] [place],  [place] [address]  [place] , Audie C, MD

## 2023-11-13 ENCOUNTER — Other Ambulatory Visit: Payer: Self-pay

## 2023-11-13 NOTE — Telephone Encounter (Signed)
 Jillian,  Where do I need to send this refill?

## 2023-11-15 MED ORDER — BOTOX 100 UNITS IJ SOLR: INTRAMUSCULAR | 3 refills | Status: AC

## 2023-12-25 ENCOUNTER — Telehealth: Payer: Self-pay | Admitting: Neurology

## 2023-12-25 NOTE — Telephone Encounter (Signed)
 Completed auth renewal request via CMM, status is pending. Key: [id number]

## 2024-01-02 ENCOUNTER — Ambulatory Visit: Admitting: Neurology

## 2024-01-03 NOTE — Telephone Encounter (Signed)
 Received approval, he will continue to fill through [facility].  Auth#: [id number] (12/25/23-01/29/25)

## 2024-01-28 ENCOUNTER — Ambulatory Visit: Admitting: Neurology

## 2024-01-28 VITALS — BP 119/80

## 2024-01-28 DIAGNOSIS — G243 Spasmodic torticollis: Secondary | ICD-10-CM

## 2024-01-28 MED ORDER — PREGABALIN 150 MG PO CAPS: 150.0000 mg | ORAL_CAPSULE | Freq: Three times a day (TID) | ORAL | 3 refills | Status: AC

## 2024-01-28 MED ORDER — ONABOTULINUMTOXINA 100 UNITS IJ SOLR: 600.0000 [IU] | Freq: Once | INTRAMUSCULAR | Status: AC

## 2024-01-28 MED ORDER — OXCARBAZEPINE 150 MG PO TABS: 150.0000 mg | ORAL_TABLET | Freq: Two times a day (BID) | ORAL | 3 refills | Status: AC

## 2024-01-28 MED ORDER — LIDOCAINE-PRILOCAINE 2.5-2.5 % EX CREA: TOPICAL_CREAM | CUTANEOUS | 11 refills | Status: AC

## 2024-01-28 MED ORDER — BACLOFEN 20 MG PO TABS: 20.0000 mg | ORAL_TABLET | Freq: Two times a day (BID) | ORAL | 3 refills | Status: AC

## 2024-01-28 MED ORDER — ONDANSETRON HCL 4 MG PO TABS: 4.0000 mg | ORAL_TABLET | Freq: Three times a day (TID) | ORAL | 3 refills | Status: AC | PRN

## 2024-01-28 MED ORDER — TIZANIDINE HCL 4 MG PO TABS: 4.0000 mg | ORAL_TABLET | Freq: Every evening | ORAL | 3 refills | Status: AC | PRN

## 2024-01-28 NOTE — Progress Notes (Signed)
 --------------------------0   Chief Complaint  Patient presents with   RM14/BOTOX     Pt is here with his Wife.  Cervical Dystonia.    ASSESSMENT AND PLAN  Brandon Lin is a 53 y.o. 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   Assessment and Plan:   EMG guided BOTOX  A injection,   Used 600 units of Botox  A (100 units of BOTOX /2cc of NS)   Right levator scapulae 50 units Right splenius cervix 25 x4=100units Right splenius capitis 100 units Right semispinalis 100 units Right iliocostalis 25x2= 50 units Right upper trapezius 50 units Right rhomboid 25 x2=50 units     Left levator scapulae 25units Left upper trapezius 25 units Left iliocostalis 25 units Left splenius cervix 25 units    DIAGNOSTIC DATA (LABS, IMAGING, TESTING) - I reviewed patient records, labs, notes, testing and imaging myself where available.   MEDICAL HISTORY:   Brandon Lin, is a 53 year-old right-handed Caucasian male, has suffered complex regional pain syndrome following a right shoulder injury in 1998, he is here every 3 months for EMG guided Botox  injection for his neck, shoulder pain, right upper extremity spasticity.  He fell off a deck and landed on his right neck and shoulder area in 1998, he had recurrent right shoulder pain, later he had multiple surgeries, each procedure making his symptoms worse, he fell off landed on his right shoulder again in 2005, he has developed intractable pain in his right shoulder and neck, dystonic postural, eventually was diagnosed with complex regional pain syndrome. From 2005 to 2007, he suffered severe neck pain, bilateral shoulder pain, abnormal  posture of his neck, right upper extremity,to the point of difficult walking, fell many times.  He has been under the care of Dr. Elbert since 2006, receiving Botox  injections every 90 days, which does help his right neck and shoulder pain.  Also under pain management at [facility] anesthesiologist Dr. Elspeth Bolls, he had significant pain around early 2013, he began to receive inpatient ketamine infusion since March 2013, every 3 months, initially spreading over 10 days with stable daily osage, slowly infusion 4 hours each day, but in August 2014, the infusion was cut back to 5 days, he most recent infusion in January 2015, he only received 3 days infusion.  He is on polypharmacy treatment,  He is taking baclofen.,Neurontin, Ativan  1 mg 3 times a day,  clonazepam , tizanidine , OxyCodone,  dosage is adjusted following his most recent hospital admission for pain management at [facility] in December 2014, currently 30 plus 10 mg every 3 hours, He is also receiving IV ketamine treatment every 6 months at [facility]  His father died of leukemia at age 54, mother  is healthy. He is disabled due to his condition. Married, has a son. 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    BOTOX  has been very helpful, but he continues to have right neck, left neck and right  shoulder spastic pain, wearing off in 2 months.   He is to to get ketamine  infusion from [facility],   There was some changes of his ketamine schedule, he got  3 days iv ketamine in Dec 2014 instead of previously scheduled 10 days, shortly after the injection, around January 2015, he developed frequent painful body muscle jerking movement, we have telephone adjusted his medications, is now taking regular dose of clonazepam  1 mg 3 times a day, Ativan  1 mg every night, baclofen 20 medical twice a day, Depakote  500 mg  every night, gabapentin 600 mg twice a day, oxycodone 30-10 mg every 3 hours, tizanidine  2 mg 2 tablets every 6 hours, his painful muscle spasm has much improved, he scheduled to have repeat IV ketamine infusion in June first for 10 days at [facility] again, his right upper extremity spasticity and pain has much improved with previous injection, most of pain is at  bilateral occipital, cervical, shoulder revision now,   he was not able to continue ketamine infusions through [facility],  I have seen him since 2015, receiving every 3 months Botox  injection for significant left more than right neck shoulder pain, posturing of right hand, which has managed his pain  In addition he was seen by local pain management, taking oxycodone ER 60 mg twice a day, immediate release 30 mg as needed, warm compression is also helpful  He recently suffered frequent UTI, a week ago, he also experienced 1 episode starting with blurry vision, progressed to stuttering of the speech, by nighttime, he got confused, needing help getting up from toilet, mumbling, then snapped out of it few minutes later, he recently has worsening gait abnormality due to right knee pain, worsening bilateral fasciitis, tends to stay in bed, rarely get out of house.  UPDATE Nov 01 2023: He continues to have significant allodynia, muscle spastic pain at bilateral shoulder, right upper extremity, to respond to Botox  injection, has been treated with oxycodone, getting prescription from primary care, Lyrica 150 mg 3 times a day, will add on Emla  gel, Trileptal  150 mg twice a day  Update January 28, 2024 He continues to have significant pain, Botox  provide limited help, under local pain management, polypharmacy,   PHYSICAL EXAM:   Vitals:   01/28/24 1143  BP: 119/80    There is no height or weight on file to calculate BMI.  PHYSICAL EXAMNIATION:  Gen: NAD, conversant, well nourised, well groomed                     Cardiovascular:  Regular rate rhythm, no peripheral edema, warm, nontender. Eyes: Conjunctivae clear without exudates or hemorrhage Neck: Supple, no carotid bruits. Pulmonary: Clear to auscultation bilaterally   NEUROLOGICAL EXAM:  MENTAL STATUS: Speech/cognition: Awake, alert, oriented to history taking and casual conversation CRANIAL NERVES: CN II: Visual fields are full to confrontation. Pupils are round equal and briskly reactive to light. CN III, IV, VI: extraocular movement are normal. No ptosis. CN V: Facial sensation is intact to light touch CN VII: Face is symmetric with normal eye closure  CN VIII: Hearing is normal to causal conversation. CN IX, X: Phonation is normal. CN XI: Head turning and shoulder shrug are intact  MOTOR:  He has neck rotated to right, mild to moderate right tilt, mild right shoulder elevation, right shoulder in anterior rotation, right elbow flexion and right hand MPI flexion, PPI and DPI extended position   He tends to lean towards his right side, mild dsytonic posture of his right leg.  REFLEXES: Reflexes are 1 and symmetric at  the biceps, triceps, knees, and ankles.    SENSORY: some allodynia at right shoulder  COORDINATION: There is no trunk or limb dysmetria noted.  GAIT/STANCE: Push-up to get up from seated position, antalgic, dragging right leg  REVIEW OF SYSTEMS:  Full 14 system review of systems performed and notable only for as above All other review of systems were negative.   ALLERGIES: Allergies  Allergen Reactions   Diclofenac Sodium     HOME MEDICATIONS: Current Outpatient Medications  Medication Sig Dispense Refill   baclofen (LIORESAL) 20 MG tablet Take 20 mg by mouth 2 (two) times daily.     botulinum toxin Type A  (BOTOX ) 100 units SOLR injection INJECT 600 UNITS INTO THE CERVICAL MUSCLES AND RIGHT UPPER EXTREMITIES BY PROVIDER EVERY 3 MONTHS. DISCARD UNUSED PORTION. 6 each 3   buPROPion (WELLBUTRIN XL) 150 MG 24 hr tablet Take 150  mg by mouth every morning.     lidocaine -prilocaine  (EMLA ) cream 1 gram qid prn 30 g 11   lubiprostone (AMITIZA) 24 MCG capsule Take 24 mcg by mouth 2 (two) times daily.     ondansetron  (ZOFRAN ) 4 MG tablet TAKE 1 TABLET BY MOUTH  EVERY 8 HOURS AS NEEDED FOR NAUSEA OR VOMITING 60 tablet 3   OXcarbazepine  (TRILEPTAL ) 150 MG tablet Take 1 tablet (150 mg total) by mouth 2 (two) times daily. 60 tablet 11   OXYCONTIN 30 MG 12 hr tablet Take 2 tablets by mouth every 8 (eight) hours.     pregabalin (LYRICA) 150 MG capsule Take 150 mg by mouth 3 (three) times daily.     tamsulosin (FLOMAX) 0.4 MG CAPS capsule Take 0.4 mg by mouth.     XARELTO 20 MG TABS tablet      OxyCODONE HCl (ROXICODONE PO) Take by mouth as directed. (Patient not taking: Reported on 01/28/2024)     Current Facility-Administered Medications  Medication Dose Route Frequency Provider Last Rate Last Admin   botulinum toxin Type A  (BOTOX ) injection 100 Units  100 Units Intramuscular Once Onita Duos, MD       botulinum toxin Type A  (BOTOX ) injection 600 Units  600 Units Intramuscular Once Onita Duos, MD       botulinum toxin Type A  (BOTOX ) injection 600 Units  600 Units Intramuscular Once Onita Duos, MD        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   Falls    Insomnia w/ sleep apnea    Other fragments of torsion dystonia     PAST SURGICAL HISTORY: Past Surgical History:  Procedure Laterality Date   KNEE SURGERY     SHOULDER SURGERY      FAMILY HISTORY: Family History  Problem Relation Age of Onset   Heart disease Mother    Leukemia Father     SOCIAL HISTORY: Social History   Socioeconomic History   Marital status: Married    Spouse name: Not on file   Number of children: 1   Years of education: Not on file   Highest education level: Not on file  Occupational History   Not on file  Tobacco Use   Smoking status:  Former   Smokeless tobacco: Never   Tobacco comments:    quit 18 years ago  Substance and Sexual Activity   Alcohol use: No   Drug use: No   Sexual activity: Not on file  Other Topics Concern  Not on file  Social History Narrative   Lives ath home w/ his wife   Social Drivers of Health   Tobacco Use: Medium Risk (10/31/2023)   Patient History    Smoking Tobacco Use: Former    Smokeless Tobacco Use: Never    Passive Exposure: Not on Actuary Strain: Not on file  Food Insecurity: Not on file  Transportation Needs: Not on file  Physical Activity: Not on file  Stress: Not on file  Social Connections: Not on file  Intimate Partner Violence: Not on file  Depression (EYV7-0): Not on file  Alcohol Screen: Not on file  Housing: Not on file  Utilities: Not on file  Health Literacy: Not on file      Modena Callander, M.D. Ph.D.  [facility] [address] [place], [place] [address] Ph: [phone] Fax: [phone]  CC:  [place] , Norm BROCKS, MD [address] [place],  [place] [address]  [place] , Norm BROCKS, MD

## 2024-01-28 NOTE — Progress Notes (Signed)
 Botox - 100 units x 6 vial Lot: [id number] Expiration: 06/2025 NDC: 9976-8854-98  Bacteriostatic 0.9% Sodium Chloride- 6mL total Lot: [id number] Expiration: 04/29/2025 NDC: 9590-8033-97  Dx: G24.3 S/P  Witnessed By. Buddy Dustman, CCMA

## 2024-02-28 ENCOUNTER — Other Ambulatory Visit: Payer: Self-pay

## 2024-04-23 ENCOUNTER — Ambulatory Visit: Admitting: Neurology
